# Patient Record
Sex: Male | Born: 1955 | Race: White | Hispanic: No | Marital: Married | Smoking: Former smoker
Health system: Southern US, Community
[De-identification: ages and names within clinical notes are randomized; demographics above are authoritative.]

## PROBLEM LIST (undated history)

## (undated) DIAGNOSIS — M1711 Unilateral primary osteoarthritis, right knee: Secondary | ICD-10-CM

## (undated) HISTORY — DX: Unilateral primary osteoarthritis, right knee: M17.11

## (undated) HISTORY — PX: BACK SURGERY: SHX140

---

## 2019-06-22 ENCOUNTER — Ambulatory Visit
Admission: EM | Admit: 2019-06-22 | Discharge: 2019-06-22 | Disposition: A | Discharge status: Home / Self Care | Payer: BC Managed Care – PPO | Attending: Emergency Medicine | Admitting: Emergency Medicine

## 2019-06-22 ENCOUNTER — Ambulatory Visit (INDEPENDENT_AMBULATORY_CARE_PROVIDER_SITE_OTHER): Payer: BC Managed Care – PPO

## 2019-06-22 ENCOUNTER — Other Ambulatory Visit: Payer: Self-pay

## 2019-06-22 ENCOUNTER — Encounter: Payer: Self-pay | Admitting: Emergency Medicine

## 2019-06-22 DIAGNOSIS — J189 Pneumonia, unspecified organism: Secondary | ICD-10-CM

## 2019-06-22 DIAGNOSIS — R05 Cough: Secondary | ICD-10-CM

## 2019-06-22 DIAGNOSIS — S2231XD Fracture of one rib, right side, subsequent encounter for fracture with routine healing: Secondary | ICD-10-CM

## 2019-06-22 DIAGNOSIS — R059 Cough, unspecified: Secondary | ICD-10-CM

## 2019-06-22 DIAGNOSIS — R0602 Shortness of breath: Secondary | ICD-10-CM | POA: Diagnosis not present

## 2019-06-22 MED ORDER — LEVOFLOXACIN 500 MG PO TABS: 500.0000 mg | ORAL_TABLET | Freq: Every day | ORAL | 0 refills | Status: DC

## 2019-06-22 MED ORDER — BENZONATATE 100 MG PO CAPS: 100.0000 mg | ORAL_CAPSULE | Freq: Three times a day (TID) | ORAL | 0 refills | Status: DC

## 2019-06-22 MED ORDER — IBUPROFEN 800 MG PO TABS: 800.0000 mg | ORAL_TABLET | Freq: Three times a day (TID) | ORAL | 0 refills | Status: DC

## 2019-06-22 NOTE — ED Provider Notes (Addendum)
[place]   [id number] 06/22/19 Arrival Time: 1632   CC: Cough   SUBJECTIVE: History from: patient.  Brent Davis is a 64 y.o. male who presents with intermittent non-productive cough, SOB x 1.5 weeks, and RT sided rib pain x 1 week.  Denies sick exposure to COVID, flu or strep.  Speculates he may have injured his RT ribs while lifting a pallet.  States his rib pain is intermittent and aching.  Has tried dayquil and nyquil with relief.  Symptoms are made worse with activity.  Reports hx of rib fractures in the past.   Denies fever, chills, fatigue, sinus pain, rhinorrhea, sore throat, wheezing, chest pain, nausea, vomiting, changes in bowel or bladder habits.    ROS: As per HPI.  All other pertinent ROS negative.     History reviewed. No pertinent past medical history. Past Surgical History:  Procedure Laterality Date  . BACK SURGERY     Allergies  Allergen Reactions  . Penicillins    No current facility-administered medications on file prior to encounter.   Current Outpatient Medications on File Prior to Encounter  Medication Sig Dispense Refill  . Pseudoeph-Doxylamine-DM-APAP (NYQUIL PO) Take by mouth.     Social History   Socioeconomic History  . Marital status: Married    Spouse name: Not on file  . Number of children: Not on file  . Years of education: Not on file  . Highest education level: Not on file  Occupational History  . Not on file  Tobacco Use  . Smoking status: Never Smoker  Substance and Sexual Activity  . Alcohol use: Yes  . Drug use: Never  . Sexual activity: Not on file  Other Topics Concern  . Not on file  Social History Narrative  . Not on file   Social Determinants of Health   Financial Resource Strain:   . Difficulty of Paying Living Expenses: Not on file  Food Insecurity:   . Worried About Charity fundraiser in the Last Year: Not on file  . Ran Out of Food in the Last Year: Not on file  Transportation Needs:   . Lack  of Transportation (Medical): Not on file  . Lack of Transportation (Non-Medical): Not on file  Physical Activity:   . Days of Exercise per Week: Not on file  . Minutes of Exercise per Session: Not on file  Stress:   . Feeling of Stress : Not on file  Social Connections:   . Frequency of Communication with Friends and Family: Not on file  . Frequency of Social Gatherings with Friends and Family: Not on file  . Attends Religious Services: Not on file  . Active Member of Clubs or Organizations: Not on file  . Attends Archivist Meetings: Not on file  . Marital Status: Not on file  Intimate Partner Violence:   . Fear of Current or Ex-Partner: Not on file  . Emotionally Abused: Not on file  . Physically Abused: Not on file  . Sexually Abused: Not on file   Family History  Problem Relation Age of Onset  . Diabetes Father   . Epilepsy Father     OBJECTIVE:  Vitals:   06/22/19 1659  BP: 132/68  Pulse: 75  Resp: (!) 24  Temp: 98.3 F (36.8 C)  TempSrc: Oral  SpO2: 94%     General appearance: alert; well-appearing, nontoxic; speaking in full sentences and tolerating own secretions HEENT: NCAT; Ears: EACs clear, TMs pearly gray; Eyes:  PERRL.  EOM grossly intact. Nose: nares patent without rhinorrhea, Throat: oropharynx clear, tonsils non erythematous or enlarged, uvula midline  Neck: supple without LAD Lungs: unlabored respirations, symmetrical air entry; cough: absent; no respiratory distress; CTAB Heart: regular rate and rhythm.   Chest wall: Mildly TTP over RT lower ribcage Skin: warm and dry Psychological: alert and cooperative; normal mood and affect  DX STUDIES:  DG Chest 2 View  Result Date: 06/22/2019 CLINICAL DATA:  Cough, shortness of breath EXAM: CHEST - 2 VIEW COMPARISON:  None. FINDINGS: Airspace opacity in the right lower lobe concerning for pneumonia. Left lung clear. Heart is normal size. No effusions. Old right rib fractures. IMPRESSION: Airspace  opacity in the right lung base concerning for pneumonia. Followup PA and lateral chest X-ray is recommended in 3-4 weeks following trial of antibiotic therapy to ensure resolution and exclude underlying malignancy. Old right rib fractures. Electronically Signed   By: Rolm Baptise M.D.   On: 06/22/2019 17:20   X-rays positive RT lower infiltrate with healing RT lower rib fractures.  I have reviewed the x-rays myself and the radiologist interpretation. I am in agreement with the radiologist interpretation.     ASSESSMENT & PLAN:  1. Pneumonia of right lower lobe due to infectious organism   2. Cough   3. Closed fracture of one rib of right side with routine healing, subsequent encounter     Meds ordered this encounter  Medications  . levofloxacin (LEVAQUIN) 500 MG tablet    Sig: Take 1 tablet (500 mg total) by mouth daily.    Dispense:  10 tablet    Refill:  0    Order Specific Question:   Supervising Provider    Answer:   Raylene Everts [id number]  . ibuprofen (ADVIL) 800 MG tablet    Sig: Take 1 tablet (800 mg total) by mouth 3 (three) times daily.    Dispense:  30 tablet    Refill:  0    Order Specific Question:   Supervising Provider    Answer:   Raylene Everts [id number]  . benzonatate (TESSALON) 100 MG capsule    Sig: Take 1 capsule (100 mg total) by mouth every 8 (eight) hours.    Dispense:  21 capsule    Refill:  0    Order Specific Question:   Supervising Provider    Answer:   Raylene Everts [id number]   Pneumonia/ Rib fractures: X-rays concerning for pneumonia and rib fractures  Rest and push fluids Tessalon Perles prescribed for cough Levofloxacin prescribed.  Take as directed and to completion Ibuprofen 800 mg prescribed.  Use as directed for pain Apply ice as needed Follow up with PCP in 6 weeks for repeat x-ray and to ensure symptoms are improving  COVID test: COVID testing ordered.  It will take between 5-7 days for test results.  Someone will contact  you regarding abnormal results.   In the meantime: You should remain isolated in your home for 10 days from symptom onset AND greater than 72 hours after symptoms resolution (absence of fever without the use of fever-reducing medication and improvement in respiratory symptoms), whichever is longer Get plenty of rest and push fluids  Call or go to the ED if you have any new or worsening symptoms such as fever, worsening cough, shortness of breath, chest tightness, chest pain, turning blue, changes in mental status, difficulty breathing, chest pain, worsening symtpoms despite medication/ treatment, etc...   Reviewed expectations re: course of current  medical issues. Questions answered. Outlined signs and symptoms indicating need for more acute intervention. Patient verbalized understanding. After Visit Summary given.         Lestine Box, PA-C 06/22/19 [place], [place], PA-C 06/22/19 1747

## 2019-06-22 NOTE — ED Triage Notes (Signed)
Symptoms for 1 - 1 1/2 weeks.  No fever.  Cough, non productive.  Patient is feeling "tired" patient also feels "winded".  Patient has pain in right torso, thinks injured lifting pallets.

## 2019-06-22 NOTE — Discharge Instructions (Signed)
Pneumonia/ Rib fractures: X-rays concerning for pneumonia and rib fractures  Rest and push fluids Tessalon Perles prescribed for cough Levofloxacin prescribed.  Take as directed and to completion Ibuprofen 800 mg prescribed.  Use as directed for pain Apply ice as needed Follow up with PCP in 6 weeks for repeat x-ray and to ensure symptoms are improving  COVID test: COVID testing ordered.  It will take between 5-7 days for test results.  Someone will contact you regarding abnormal results.   In the meantime: You should remain isolated in your home for 10 days from symptom onset AND greater than 72 hours after symptoms resolution (absence of fever without the use of fever-reducing medication and improvement in respiratory symptoms), whichever is longer Get plenty of rest and push fluids  Call or go to the ED if you have any new or worsening symptoms such as fever, worsening cough, shortness of breath, chest tightness, chest pain, turning blue, changes in mental status, difficulty breathing, chest pain, worsening symtpoms despite medication/ treatment, etc..Marland Kitchen

## 2019-06-23 LAB — NOVEL CORONAVIRUS, NAA: SARS-CoV-2, NAA: NOT DETECTED

## 2019-12-20 ENCOUNTER — Other Ambulatory Visit: Payer: Self-pay

## 2019-12-20 ENCOUNTER — Ambulatory Visit
Admission: EM | Admit: 2019-12-20 | Discharge: 2019-12-20 | Disposition: A | Discharge status: Home / Self Care | Payer: BC Managed Care – PPO | Attending: Emergency Medicine | Admitting: Emergency Medicine

## 2019-12-20 ENCOUNTER — Encounter: Payer: Self-pay | Admitting: Emergency Medicine

## 2019-12-20 DIAGNOSIS — Z1152 Encounter for screening for COVID-19: Secondary | ICD-10-CM | POA: Diagnosis not present

## 2019-12-20 NOTE — Discharge Instructions (Addendum)

## 2019-12-20 NOTE — ED Triage Notes (Signed)
Needs covid test for travel  

## 2019-12-20 NOTE — ED Provider Notes (Addendum)
[place]   [id number] 12/20/19 Arrival Time: 3570   CC: COVID screen for travel  SUBJECTIVE: History from: patient.  Brent Davis is a 64 y.o. male  who presents for COVID testing for travel.  Denies sick exposure to COVID, flu or strep.  Denies recent travel.  Denies aggravating or alleviating symptoms.  Denies previous COVID infection.   Denies fever, chills, fatigue, nasal congestion, rhinorrhea, sore throat, cough, SOB, wheezing, chest pain, nausea, vomiting, changes in bowel or bladder habits.    ROS: As per HPI.  All other pertinent ROS negative.     History reviewed. No pertinent past medical history. Past Surgical History:  Procedure Laterality Date  . BACK SURGERY     Allergies  Allergen Reactions  . Penicillins    No current facility-administered medications on file prior to encounter.   Current Outpatient Medications on File Prior to Encounter  Medication Sig Dispense Refill  . benzonatate (TESSALON) 100 MG capsule Take 1 capsule (100 mg total) by mouth every 8 (eight) hours. 21 capsule 0  . ibuprofen (ADVIL) 800 MG tablet Take 1 tablet (800 mg total) by mouth 3 (three) times daily. 30 tablet 0  . levofloxacin (LEVAQUIN) 500 MG tablet Take 1 tablet (500 mg total) by mouth daily. 10 tablet 0  . Pseudoeph-Doxylamine-DM-APAP (NYQUIL PO) Take by mouth.     Social History   Socioeconomic History  . Marital status: Married    Spouse name: Not on file  . Number of children: Not on file  . Years of education: Not on file  . Highest education level: Not on file  Occupational History  . Not on file  Tobacco Use  . Smoking status: Never Smoker  . Smokeless tobacco: Never Used  Substance and Sexual Activity  . Alcohol use: Yes  . Drug use: Never  . Sexual activity: Not on file  Other Topics Concern  . Not on file  Social History Narrative  . Not on file   Social Determinants of Health   Financial Resource Strain:   . Difficulty of Paying  Living Expenses:   Food Insecurity:   . Worried About Charity fundraiser in the Last Year:   . Arboriculturist in the Last Year:   Transportation Needs:   . Film/video editor (Medical):   Marland Kitchen Lack of Transportation (Non-Medical):   Physical Activity:   . Days of Exercise per Week:   . Minutes of Exercise per Session:   Stress:   . Feeling of Stress :   Social Connections:   . Frequency of Communication with Friends and Family:   . Frequency of Social Gatherings with Friends and Family:   . Attends Religious Services:   . Active Member of Clubs or Organizations:   . Attends Archivist Meetings:   Marland Kitchen Marital Status:   Intimate Partner Violence:   . Fear of Current or Ex-Partner:   . Emotionally Abused:   Marland Kitchen Physically Abused:   . Sexually Abused:    Family History  Problem Relation Age of Onset  . Diabetes Father   . Epilepsy Father     OBJECTIVE:  Vitals:   12/20/19 1607  BP: 126/79  Pulse: (!) 58  Resp: 17  Temp: 98.2 F (36.8 C)  TempSrc: Oral  SpO2: 94%     General appearance: alert; appears fatigued, but nontoxic; speaking in full sentences and tolerating own secretions HEENT: NCAT; Ears: EACs clear, TMs pearly gray; Eyes: PERRL.  EOM grossly intact. Sinuses: nontender; Nose: nares patent without rhinorrhea, Throat: oropharynx clear, tonsils non erythematous or enlarged, uvula midline  Neck: supple without LAD Lungs: unlabored respirations, symmetrical air entry; cough: absent; no respiratory distress; CTAB Heart: regular rate and rhythm.  Radial pulses 2+ symmetrical bilaterally Skin: warm and dry Psychological: alert and cooperative; normal mood and affect  LABS:  No results found for this or any previous visit (from the past 24 hour(s)).   ASSESSMENT & PLAN:  1. Encounter for screening for COVID-19     No orders of the defined types were placed in this encounter.   Discharge Instructions...    COVID testing ordered.  It will take  between 2-7 days for test results.  Someone will contact you regarding abnormal results.    In the meantime: You should remain isolated in your home for 10 days from symptom onset AND greater than 24 hours after symptoms resolution (absence of fever without the use of fever-reducing medication and improvement in respiratory symptoms), whichever is longer Get plenty of rest and push fluids Use medications daily for symptom relief Use OTC medications like ibuprofen or tylenol as needed fever or pain Call or go to the ED if you have any new or worsening symptoms such as fever, worsening cough, shortness of breath, chest tightness, chest pain, turning blue, changes in mental status, etc...   Reviewed expectations re: course of current medical issues. Questions answered. Outlined signs and symptoms indicating need for more acute intervention. Patient verbalized understanding. After Visit Summary given.      Note: This document was prepared using Dragon voice recognition software and may include unintentional dictation errors.     Emerson Monte, FNP 12/20/19 1614    Emerson Monte, FNP 12/20/19 1615

## 2019-12-22 LAB — SARS-COV-2, NAA 2 DAY TAT

## 2019-12-22 LAB — NOVEL CORONAVIRUS, NAA: SARS-CoV-2, NAA: NOT DETECTED

## 2020-10-15 ENCOUNTER — Other Ambulatory Visit: Payer: Self-pay

## 2020-10-15 ENCOUNTER — Ambulatory Visit
Admission: EM | Admit: 2020-10-15 | Discharge: 2020-10-15 | Discharge status: Against Medical Advice | Payer: BC Managed Care – PPO

## 2020-10-15 ENCOUNTER — Ambulatory Visit (INDEPENDENT_AMBULATORY_CARE_PROVIDER_SITE_OTHER): Payer: BC Managed Care – PPO

## 2020-10-15 ENCOUNTER — Ambulatory Visit
Admission: EM | Admit: 2020-10-15 | Discharge: 2020-10-15 | Disposition: A | Discharge status: Home / Self Care | Payer: BC Managed Care – PPO | Attending: Family Medicine | Admitting: Family Medicine

## 2020-10-15 DIAGNOSIS — M25561 Pain in right knee: Secondary | ICD-10-CM

## 2020-10-15 DIAGNOSIS — S8391XA Sprain of unspecified site of right knee, initial encounter: Secondary | ICD-10-CM

## 2020-10-15 NOTE — Discharge Instructions (Addendum)
May take 800 mg ibuprofen with 1000 mg of Tylenol.  Do not exceed 4000 mg of Tylenol in 24 hours.  Suspect soft tissue injury  We have put a brace on your knee.  Follow up with orthopedics for further imaging and management

## 2020-10-15 NOTE — ED Triage Notes (Signed)
Pt presents with right knee pain, from injury last week. Heard a pop but swelling and pain have not improved

## 2020-10-22 NOTE — ED Provider Notes (Signed)
[place]   [id number] 10/15/20 Arrival Time: 1757  ZR:[id number] PAIN  SUBJECTIVE: History from: patient. Brent Davis is a 65 y.o. male complains of right knee pain that began about a week ago.  Reports that he was working and felt a pop in the knee. States that the area has been swollen and painful since then. Localizes the pain to the medial aspect of the right knee. Describes the pain as constant and achy in character with intermittent sharp pain. Has tried OTC medications without relief. Symptoms are made worse with activity. Denies similar symptoms in the past.  Denies fever, chills, erythema, ecchymosis, weakness, numbness and tingling, saddle paresthesias, loss of bowel or bladder function.      ROS: As per HPI.  All other pertinent ROS negative.     History reviewed. No pertinent past medical history. Past Surgical History:  Procedure Laterality Date   BACK SURGERY     Allergies  Allergen Reactions   Penicillins    No current facility-administered medications on file prior to encounter.   Current Outpatient Medications on File Prior to Encounter  Medication Sig Dispense Refill   benzonatate (TESSALON) 100 MG capsule Take 1 capsule (100 mg total) by mouth every 8 (eight) hours. 21 capsule 0   ibuprofen (ADVIL) 800 MG tablet Take 1 tablet (800 mg total) by mouth 3 (three) times daily. 30 tablet 0   levofloxacin (LEVAQUIN) 500 MG tablet Take 1 tablet (500 mg total) by mouth daily. 10 tablet 0   Pseudoeph-Doxylamine-DM-APAP (NYQUIL PO) Take by mouth.     Social History   Socioeconomic History   Marital status: Married    Spouse name: Not on file   Number of children: Not on file   Years of education: Not on file   Highest education level: Not on file  Occupational History   Not on file  Tobacco Use   Smoking status: Never   Smokeless tobacco: Never  Substance and Sexual Activity   Alcohol use: Yes   Drug use: Never   Sexual activity: Not on file  Other  Topics Concern   Not on file  Social History Narrative   Not on file   Social Determinants of Health   Financial Resource Strain: Not on file  Food Insecurity: Not on file  Transportation Needs: Not on file  Physical Activity: Not on file  Stress: Not on file  Social Connections: Not on file  Intimate Partner Violence: Not on file   Family History  Problem Relation Age of Onset   Diabetes Father    Epilepsy Father     OBJECTIVE:  Vitals:   10/15/20 1824  BP: (!) 143/87  Pulse: (!) 56  Resp: 16  Temp: (!) 97.3 F (36.3 C)  TempSrc: Tympanic  SpO2: 98%    General appearance: ALERT; in no acute distress.  Head: NCAT Lungs: Normal respiratory effort CV: pulses 2+ bilaterally. Cap refill < 2 seconds Musculoskeletal:  Inspection: Skin warm, dry, clear and intact No erythema noted Effusion to anterior right knee Palpation: Medial right knee tender to palpation ROM: Limited ROM active and passive to right knee Skin: warm and dry Neurologic: Ambulates without difficulty; Sensation intact about the upper/ lower extremities Psychological: alert and cooperative; normal mood and affect  DIAGNOSTIC STUDIES:  No results found.   ASSESSMENT & PLAN:  1. Sprain of right knee, unspecified ligament, initial encounter   2. Acute pain of right knee    Xray is negative for fracture or  misalignment today Brace applied to right knee in office Wear this when up and active Continue conservative management of rest, ice, and gentle stretches Take ibuprofen as needed for pain relief (may cause abdominal discomfort, ulcers, and GI bleeds avoid taking with other NSAIDs) Follow up with orthopedics Suspect soft tissue injury given continued pain and swelling Return or go to the ER if you have any new or worsening symptoms (fever, chills, chest pain, abdominal pain, changes in bowel or bladder habits, pain radiating into lower legs)   Reviewed expectations re: course of current medical  issues. Questions answered. Outlined signs and symptoms indicating need for more acute intervention. Patient verbalized understanding. After Visit Summary given.        Faustino Congress, NP 10/22/20 [phone]

## 2020-11-01 ENCOUNTER — Encounter: Payer: Self-pay | Admitting: Orthopaedic Surgery

## 2020-11-01 ENCOUNTER — Ambulatory Visit (INDEPENDENT_AMBULATORY_CARE_PROVIDER_SITE_OTHER): Payer: BC Managed Care – PPO | Admitting: Orthopaedic Surgery

## 2020-11-01 ENCOUNTER — Other Ambulatory Visit: Payer: Self-pay

## 2020-11-01 VITALS — BP 126/82 | HR 64 | Ht 71.0 in | Wt 192.5 lb

## 2020-11-01 DIAGNOSIS — M25561 Pain in right knee: Secondary | ICD-10-CM | POA: Diagnosis not present

## 2020-11-01 DIAGNOSIS — G8929 Other chronic pain: Secondary | ICD-10-CM

## 2020-11-01 DIAGNOSIS — M25361 Other instability, right knee: Secondary | ICD-10-CM

## 2020-11-01 NOTE — Progress Notes (Signed)
Subjective:    Patient ID: Brent Davis, male    DOB: 1956-02-18, 65 y.o.   MRN: [id number]  HPI He hurt his right knee while working at [employer] when taking a pallet of fruit juice off a truck.  He pulled jack to get the pallet moving when he felt a pop and then had pain in the right knee.  His pain got worse.  He went to Urgent Care on 10-15-20. The injury was about a week earlier.  He has pain, swelling of the right knee, giving way now and medial pain.  I have reviewed the notes and X-rays.  I have independently reviewed and interpreted x-rays of this patient done at another site by another physician or qualified health professional.  His knee is getting gradually worse.  He has pain at night and pain when walking.  He has giving way.  He has no redness or numbness.  Rest, ice, heat and rubs have not helped. Advil has not helped that much.   Review of Systems  Constitutional:  Positive for activity change.  Musculoskeletal:  Positive for arthralgias, back pain, gait problem and joint swelling.  All other systems reviewed and are negative. For Review of Systems, all other systems reviewed and are negative.  The following is a summary of the past history medically, past history surgically, known current medicines, social history and family history.  This information is gathered electronically by the computer from prior information and documentation.  I review this each visit and have found including this information at this point in the chart is beneficial and informative.   History reviewed. No pertinent past medical history.  Past Surgical History:  Procedure Laterality Date   BACK SURGERY      Current Outpatient Medications on File Prior to Visit  Medication Sig Dispense Refill   benzonatate (TESSALON) 100 MG capsule Take 1 capsule (100 mg total) by mouth every 8 (eight) hours. 21 capsule 0   ibuprofen (ADVIL) 800 MG tablet Take 1 tablet (800 mg total) by mouth 3 (three)  times daily. 30 tablet 0   levofloxacin (LEVAQUIN) 500 MG tablet Take 1 tablet (500 mg total) by mouth daily. 10 tablet 0   Pseudoeph-Doxylamine-DM-APAP (NYQUIL PO) Take by mouth.     No current facility-administered medications on file prior to visit.    Social History   Socioeconomic History   Marital status: Married    Spouse name: Not on file   Number of children: Not on file   Years of education: Not on file   Highest education level: Not on file  Occupational History   Not on file  Tobacco Use   Smoking status: Never   Smokeless tobacco: Never  Substance and Sexual Activity   Alcohol use: Yes   Drug use: Never   Sexual activity: Not on file  Other Topics Concern   Not on file  Social History Narrative   Not on file   Social Determinants of Health   Financial Resource Strain: Not on file  Food Insecurity: Not on file  Transportation Needs: Not on file  Physical Activity: Not on file  Stress: Not on file  Social Connections: Not on file  Intimate Partner Violence: Not on file    Family History  Problem Relation Age of Onset   Diabetes Father    Epilepsy Father     BP 126/82   Pulse 64   Ht 5\' 11"  (1.803 m)   Wt 192 lb 8  oz (87.3 kg)   BMI 26.85 kg/m   Body mass index is 26.85 kg/m.     Objective:   Physical Exam Vitals and nursing note reviewed. Exam conducted with a chaperone present.  Constitutional:      Appearance: He is well-developed.  HENT:     Head: Normocephalic and atraumatic.  Eyes:     Conjunctiva/sclera: Conjunctivae normal.     Pupils: Pupils are equal, round, and reactive to light.  Cardiovascular:     Rate and Rhythm: Normal rate and regular rhythm.  Pulmonary:     Effort: Pulmonary effort is normal.  Abdominal:     Palpations: Abdomen is soft.  Musculoskeletal:     Cervical back: Normal range of motion and neck supple.       Legs:  Skin:    General: Skin is warm and dry.  Neurological:     Mental Status: He is alert  and oriented to person, place, and time.     Cranial Nerves: No cranial nerve deficit.     Motor: No abnormal muscle tone.     Coordination: Coordination normal.     Deep Tendon Reflexes: Reflexes are normal and symmetric. Reflexes normal.  Psychiatric:        Behavior: Behavior normal.        Thought Content: Thought content normal.        Judgment: Judgment normal.          Assessment & Plan:   Encounter Diagnoses  Name Primary?   Chronic pain of right knee Yes   Knee instability, right    I am very concerned about medial meniscus injury.  He also has slight laxity of ACL.  I will get MRI.  We need to get approval from [employer]  Return in three weeks.  Call if any problem.  Precautions discussed.  Electronically Signed Sanjuana Kava, MD 6/23/202211:06 AM

## 2020-11-22 ENCOUNTER — Ambulatory Visit: Payer: BC Managed Care – PPO | Admitting: Orthopaedic Surgery

## 2020-11-27 ENCOUNTER — Other Ambulatory Visit: Payer: Self-pay

## 2020-11-27 ENCOUNTER — Ambulatory Visit (HOSPITAL_COMMUNITY)
Admission: RE | Admit: 2020-11-27 | Discharge: 2020-11-27 | Disposition: A | Discharge status: Home / Self Care | Payer: BC Managed Care – PPO | Source: Ambulatory Visit | Attending: Orthopaedic Surgery | Admitting: Orthopaedic Surgery

## 2020-11-27 DIAGNOSIS — M25361 Other instability, right knee: Secondary | ICD-10-CM | POA: Insufficient documentation

## 2020-11-29 ENCOUNTER — Ambulatory Visit (INDEPENDENT_AMBULATORY_CARE_PROVIDER_SITE_OTHER): Payer: BC Managed Care – PPO | Admitting: Orthopaedic Surgery

## 2020-11-29 ENCOUNTER — Other Ambulatory Visit: Payer: Self-pay

## 2020-11-29 ENCOUNTER — Encounter: Payer: Self-pay | Admitting: Orthopaedic Surgery

## 2020-11-29 DIAGNOSIS — S83242D Other tear of medial meniscus, current injury, left knee, subsequent encounter: Secondary | ICD-10-CM

## 2020-11-29 NOTE — Patient Instructions (Signed)
Return to clinic to see Dr. Aline Brochure or Dr Amedeo Kinsman for surgery consultation. Note for work, no squatting

## 2020-11-29 NOTE — Progress Notes (Signed)
My knee hurts more.  He has right knee pain with giving way, swelling, popping.  He had MRI which showed:  IMPRESSION: Complete radial tear at or just peripheral to the root of the posterior horn of the medial meniscus.   Small, minimally depressed fractures of the subchondral bone plate of the weight-bearing medial femoral condyle and adjacent medial tibial plateau with associated marrow edema. Edema is more extensive in the femur.   0.3 x 1.2 cm near full-thickness cartilage defect weight-bearing medial femoral condyle. There is a smaller near full-thickness cartilage defect in the posterior weight-bearing lateral femoral condyle.   I have explained the findings to him.  I have told him about the fractures which are healing and do not need surgery.  I have explained the cartilage defect.  I will have him see Dr. Aline Brochure or Dr. Amedeo Kinsman for possible arthroscopy.  I have independently reviewed the MRI.    ROM of the right knee is 0 to 105, pain medially, effusion, crepitus, positive medial McMurray, no distal edema, NV intact.  Encounter Diagnosis  Name Primary?   Other tear of medial meniscus, current injury, left knee, subsequent encounter Yes   See Dr. Lemmie Evens or Dr. Loletha Grayer.  Call if any problem.  Precautions discussed.  Electronically Signed Brent Kava, MD 7/21/20229:20 AM

## 2020-12-14 ENCOUNTER — Other Ambulatory Visit: Payer: Self-pay

## 2020-12-14 ENCOUNTER — Encounter: Payer: Self-pay | Admitting: Orthopedic Surgery

## 2020-12-14 ENCOUNTER — Ambulatory Visit (INDEPENDENT_AMBULATORY_CARE_PROVIDER_SITE_OTHER): Payer: BC Managed Care – PPO | Admitting: Orthopedic Surgery

## 2020-12-14 VITALS — BP 151/70 | HR 72 | Ht 71.0 in | Wt 194.2 lb

## 2020-12-14 DIAGNOSIS — S83231A Complex tear of medial meniscus, current injury, right knee, initial encounter: Secondary | ICD-10-CM | POA: Diagnosis not present

## 2020-12-14 NOTE — Patient Instructions (Signed)
Out of work until the next visit

## 2020-12-14 NOTE — Progress Notes (Signed)
New Patient Visit  Assessment: Brent Davis is a 65 y.o. male with the following: Right knee pain, with posterior medial meniscus tear, adjacent to the root; extensive bony edema within the medial femoral condyle and medial tibial plateau  Plan: Reviewed radiographs and the MRI with patient in clinic today.  He has exquisite tenderness to palpation within the medial femoral condyle, as well as the medial tibial plateau.  He does exhibit some tenderness along the medial joint line.  My biggest concern at this point, as the areas that are lighting up on the MRI.  This was discussed with the patient, and he is aware that these are essentially nondisplaced fractures.  I do not think that surgery at this time will improve his overall symptoms.  I have recommended that he stay off of his leg is much as possible, and have provided him with a letter excusing him from work for the next 6 weeks.  Depending on his physical exam at the next clinic visit, we could discuss an arthroscopic procedure in more detail.  All questions were answered he is amenable this plan.   Follow-up: Return in about 6 weeks (around 01/25/2021).  Subjective:  Chief Complaint  Patient presents with   Knee Pain    Consult for right knee/DR.K pt    History of Present Illness: Brent Davis is a 65 y.o. male who presents for evaluation of right knee pain.  He is previously been evaluated by Dr. Luna Glasgow, and referred to me for surgical discussion.  Approximately 2 months ago, he sustained an injury to the medial aspect of his right knee, while trying to move a palate off a truck at work.  He felt a pop in his right knee.  He went to the emergency department about a week later.  He noticed some swelling in the right knee.  This is since improved.  He is not taking any medications on a consistent basis.  He has continued to work.  However, he notices significant discomfort with certain motions.  He has difficulty with heavier  objects.  Overall, his pain has improved.  He has obtained an MRI, and presents today for further discussion.   Review of Systems: No fevers or chills No numbness or tingling No chest pain No shortness of breath No bowel or bladder dysfunction No GI distress No headaches   Medical History:  No past medical history on file.  Past Surgical History:  Procedure Laterality Date   BACK SURGERY      Family History  Problem Relation Age of Onset   Diabetes Father    Epilepsy Father    Social History   Tobacco Use   Smoking status: Never   Smokeless tobacco: Never  Substance Use Topics   Alcohol use: Yes   Drug use: Never    Allergies  Allergen Reactions   Penicillins     No outpatient medications have been marked as taking for the 12/14/20 encounter (Office Visit) with Mordecai Rasmussen, MD.    Objective: BP (!) 151/70   Pulse 72   Ht '5\' 11"'$  (1.803 m)   Wt 194 lb 3.2 oz (88.1 kg)   BMI 27.09 kg/m   Physical Exam:  General: Alert and oriented. and No acute distress. Gait: Right sided antalgic gait.  Evaluation of the right knee demonstrates a mild effusion.  Neutral overall alignment.  Range of motion from 0 to 100 degrees.  He has some difficulty with flexion beyond 100 degrees.  He  has exquisite tenderness to palpation over the medial femoral condyle.  Exquisite tenderness palpation over the medial tibial plateau.  Mild tenderness to palpation along the medial joint line.  He also has some tenderness in the posterior lateral aspect of his knee.  This is vague.  Not no specific focal tenderness.  He does have a Baker's cyst.  Negative Lachman.  IMAGING: I personally reviewed images previously obtained in clinic  X-rays of the right knee demonstrate neutral overall alignment.  Mild degenerative changes overall.  MRI of the right knee demonstrates extensive bony edema within the medial femoral condyle, as well as the medial tibial plateau.  No depression is  appreciated.  He has a radial tear adjacent to the posterior root of the medial meniscus.  No significant displacement of the meniscus.  Small full-thickness cartilage defect of the weightbearing aspect of the medial femoral condyle.  New Medications:  No orders of the defined types were placed in this encounter.     Mordecai Rasmussen, MD  12/15/2020 7:29 AM

## 2020-12-15 ENCOUNTER — Encounter: Payer: Self-pay | Admitting: Orthopedic Surgery

## 2020-12-19 ENCOUNTER — Telehealth: Payer: Self-pay | Admitting: Orthopedic Surgery

## 2020-12-19 NOTE — Telephone Encounter (Signed)
Called patient to relay forms received in fax from [employer]. Patient states that this is not part of workers comp, but is for short-term disability. Discussed [employer] form/fee process. Aware.

## 2021-01-09 ENCOUNTER — Telehealth: Payer: Self-pay

## 2021-01-09 NOTE — Telephone Encounter (Signed)
Patient relays that Worker's comp has denied his claim; therefore, we can keep all related services under his health insurance information as we have been doing. States worker's comp, and attorney's office, notified him that there was no accident involved; therefore, denied.

## 2021-01-09 NOTE — Telephone Encounter (Signed)
Pt stated he was calling to give you information. Please call back.

## 2021-01-23 ENCOUNTER — Encounter: Payer: Self-pay | Admitting: Orthopedic Surgery

## 2021-01-23 ENCOUNTER — Ambulatory Visit: Payer: BC Managed Care – PPO

## 2021-01-23 ENCOUNTER — Ambulatory Visit (INDEPENDENT_AMBULATORY_CARE_PROVIDER_SITE_OTHER): Payer: BC Managed Care – PPO | Admitting: Orthopedic Surgery

## 2021-01-23 ENCOUNTER — Other Ambulatory Visit: Payer: Self-pay

## 2021-01-23 VITALS — BP 130/77 | HR 71 | Ht 71.0 in | Wt 203.0 lb

## 2021-01-23 DIAGNOSIS — G8929 Other chronic pain: Secondary | ICD-10-CM

## 2021-01-23 DIAGNOSIS — S83231A Complex tear of medial meniscus, current injury, right knee, initial encounter: Secondary | ICD-10-CM | POA: Diagnosis not present

## 2021-01-23 DIAGNOSIS — M1711 Unilateral primary osteoarthritis, right knee: Secondary | ICD-10-CM | POA: Diagnosis not present

## 2021-01-23 NOTE — Progress Notes (Signed)
Orthopaedic Clinic Return  Assessment: Brent Davis is a 65 y.o. male with the following: Right knee arthritis, posterior horn medial meniscus tear  Plan: Pain is improved.  He would like to go back to work.  He was fitted to with a brace and advised to wear it to work.  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  He is in agreement.  Follow up in 6 weeks.   Follow-up: Return in about 6 weeks (around 03/06/2021).   Subjective:  Chief Complaint  Patient presents with   Knee Pain    Rt knee f/u. Here to discuss back to work and restrictions.     History of Present Illness: Brent Davis is a 65 y.o. male who returns to clinic for repeat evaluation of his right knee pain.  He has been out of work for the past 6 weeks.  His pain is improved.  He does not take any medications.  He has never had an injection.  Pain is primarily in his medial knee.  He wants to return to work.  He would like to avoid surgery.  He states that he can retire in a little over a year.    Review of Systems: No fevers or chills No numbness or tingling No chest pain No shortness of breath No bowel or bladder dysfunction No GI distress No headaches   Objective: BP 130/77   Pulse 71   Ht '5\' 11"'$  (1.803 m)   Wt 203 lb (92.1 kg)   BMI 28.31 kg/m   Physical Exam:  Alert and oriented, no acute distress  Right sided antalgic gait.  Mild varus alignment.  Tender over the medial femoral condyle.  Mild tenderness over the medial tibial plateau.  Minimal tenderness over the medial joint line.  Near full extension.  Flexion beyond 120 degrees.   IMAGING: I personally ordered and reviewed the following images:  XR of the right knee were obtained in clinic.  Mild varus alignment.   No acute injury.  Medial joint space narrowing.  Near complete loss medially.  Small osteophytes.  Contour of medial femoral condyle  remains unchanged.   Impression: right knee with moderate degenerative changes within the medial compartment.   Mordecai Rasmussen, MD 01/23/2021 2:52 PM

## 2021-01-24 ENCOUNTER — Encounter: Payer: Self-pay | Admitting: Orthopedic Surgery

## 2021-01-25 ENCOUNTER — Ambulatory Visit: Payer: BC Managed Care – PPO | Admitting: Orthopedic Surgery

## 2021-01-31 ENCOUNTER — Telehealth: Payer: Self-pay | Admitting: Orthopedic Surgery

## 2021-01-31 NOTE — Telephone Encounter (Signed)
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

## 2021-02-01 ENCOUNTER — Encounter: Payer: Self-pay | Admitting: Orthopedic Surgery

## 2021-02-01 NOTE — Telephone Encounter (Signed)
Per Dr Amedeo Kinsman', form updated "he should not lift or more than 100 lbs" / "to wear Playmaker brace at work". Note for work updated accordingly.  Form and note faxed to patient's employer, fax#[phone]. Called patient; aware.

## 2021-02-20 ENCOUNTER — Ambulatory Visit: Payer: BC Managed Care – PPO | Admitting: Orthopedic Surgery

## 2021-02-27 ENCOUNTER — Ambulatory Visit (INDEPENDENT_AMBULATORY_CARE_PROVIDER_SITE_OTHER): Payer: BC Managed Care – PPO | Admitting: Orthopedic Surgery

## 2021-02-27 ENCOUNTER — Other Ambulatory Visit: Payer: Self-pay

## 2021-02-27 ENCOUNTER — Encounter: Payer: Self-pay | Admitting: Orthopedic Surgery

## 2021-02-27 VITALS — BP 138/80 | HR 63 | Ht 71.0 in | Wt 201.0 lb

## 2021-02-27 DIAGNOSIS — S83241D Other tear of medial meniscus, current injury, right knee, subsequent encounter: Secondary | ICD-10-CM

## 2021-02-27 DIAGNOSIS — S83242D Other tear of medial meniscus, current injury, left knee, subsequent encounter: Secondary | ICD-10-CM

## 2021-02-27 NOTE — Progress Notes (Signed)
Orthopaedic Clinic Return  Assessment: Brent Davis is a 65 y.o. male with the following: Right knee arthritis, posterior horn medial meniscus tear  Plan: His pain is improved.  He has returned to work.  He has been wearing the brace, but stopped wearing it a week ago while at work.  He notes occasional pains in the medial aspect of his knee.  Occasional pain and difficulty with flexion in the posterior aspect of his knee.  He is not interested in proceeding with surgery at this time, which I think is reasonable.  He can continue using his brace.  He can take medications as needed.  He can also return for an injection in the right knee if his pain worsens.  No need to schedule follow-up appointment, but we are available if he needs anything in the future.  Follow-up: Return if symptoms worsen or fail to improve.   Subjective:  Chief Complaint  Patient presents with   Knee Pain    Rt knee pain much better but having some discomfort from bakers cyst    History of Present Illness: Brent Davis is a 65 y.o. male who returns to clinic for repeat evaluation of his right knee pain.  He has returned to work, and has been able to do most things.  Occasionally, he will avoid pulling heavier pallets because he knows it will cause some irritation potentially.  He has occasional pains in the posterior aspect of his knee, which does limit his flexion.  No Baker's cyst on the recent MRI.  He also states that that pain has been there for a while, prior to his most recent injury.  He has occasional sharp pains in the medial aspect of his right knee.  He is taking ibuprofen rarely.  He is pleased with the improvements since he for started to have the knee pain earlier this summer.   Review of Systems: No fevers or chills No numbness or tingling No chest pain No shortness of breath No bowel or bladder dysfunction No GI distress No headaches   Objective: BP 138/80   Pulse 63   Ht 5\' 11"   (1.803 m)   Wt 201 lb (91.2 kg)   BMI 28.03 kg/m   Physical Exam:  Alert and oriented, no acute distress  Right sided mild antalgic gait.  Mild varus alignment.  Minimal tenderness over the medial femoral condyle.  Mild tenderness over the medial tibial plateau.  Minimal tenderness over the medial joint line.  Near full extension.  Flexion beyond 120 degrees.   IMAGING: I personally ordered and reviewed the following images:  No new imaging obtained today.  Brent Rasmussen, MD 02/27/2021 9:25 AM

## 2021-08-16 ENCOUNTER — Ambulatory Visit
Admission: EM | Admit: 2021-08-16 | Discharge: 2021-08-16 | Disposition: A | Discharge status: Home / Self Care | Payer: No Typology Code available for payment source | Attending: Family Medicine | Admitting: Family Medicine

## 2021-08-16 ENCOUNTER — Encounter: Payer: Self-pay | Admitting: Emergency Medicine

## 2021-08-16 DIAGNOSIS — J209 Acute bronchitis, unspecified: Secondary | ICD-10-CM

## 2021-08-16 DIAGNOSIS — J3089 Other allergic rhinitis: Secondary | ICD-10-CM

## 2021-08-16 MED ORDER — CETIRIZINE HCL 10 MG PO TABS: 10.0000 mg | ORAL_TABLET | Freq: Every day | ORAL | 2 refills | Status: DC

## 2021-08-16 MED ORDER — FLUTICASONE PROPIONATE 50 MCG/ACT NA SUSP: 1.0000 | Freq: Two times a day (BID) | NASAL | 2 refills | Status: DC

## 2021-08-16 MED ORDER — PREDNISONE 20 MG PO TABS: 40.0000 mg | ORAL_TABLET | Freq: Every day | ORAL | 0 refills | Status: DC

## 2021-08-16 NOTE — ED Triage Notes (Signed)
Dry cough, irritated throat x 1.5 weeks.  Has been using mucinex without relief. ?

## 2021-08-16 NOTE — ED Provider Notes (Signed)
?[facility] ? ? ? ?CSN: [id number] ?Arrival date & time: 08/16/21  1135 ? ? ?  ? ?History   ?Chief Complaint ?Chief Complaint  ?Patient presents with  ? Cough  ? ? ?HPI ?Brent Davis is a 66 y.o. male.  ? ?Presenting today with 1 to 2 weeks of dry, hacking cough, scratchy throat, rhinorrhea.  Denies fever, chills, body aches, chest pain, shortness of breath, abdominal pain, nausea vomiting or diarrhea.  Has been taking Mucinex with no relief.  States he gets something like this the beginning of every spring. ? ?History reviewed. No pertinent past medical history. ? ?There are no problems to display for this patient. ? ? ?Past Surgical History:  ?Procedure Laterality Date  ? BACK SURGERY    ? ? ? ? ? ?Home Medications   ? ?Prior to Admission medications   ?Medication Sig Start Date End Date Taking? Authorizing Provider  ?cetirizine (ZYRTEC ALLERGY) 10 MG tablet Take 1 tablet (10 mg total) by mouth daily. 08/16/21  Yes Volney American, PA-C  ?fluticasone (FLONASE) 50 MCG/ACT nasal spray Place 1 spray into both nostrils 2 (two) times daily. 08/16/21  Yes Volney American, PA-C  ?predniSONE (DELTASONE) 20 MG tablet Take 2 tablets (40 mg total) by mouth daily with breakfast. 08/16/21  Yes Volney American, PA-C  ? ? ?Family History ?Family History  ?Problem Relation Age of Onset  ? Diabetes Father   ? Epilepsy Father   ? ? ?Social History ?Social History  ? ?Tobacco Use  ? Smoking status: Never  ? Smokeless tobacco: Never  ?Substance Use Topics  ? Alcohol use: Yes  ? Drug use: Never  ? ? ? ?Allergies   ?Penicillins ? ? ?Review of Systems ?Review of Systems ?Per HPI ? ?Physical Exam ?Triage Vital Signs ?ED Triage Vitals  ?Enc Vitals Group  ?   BP 08/16/21 1232 137/80  ?   Pulse Rate 08/16/21 1232 82  ?   Resp 08/16/21 1232 18  ?   Temp 08/16/21 1232 98.7 ?F (37.1 ?C)  ?   Temp Source 08/16/21 1232 Oral  ?   SpO2 08/16/21 1232 95 %  ?   Weight --   ?   Height --   ?   Head Circumference --   ?    Peak Flow --   ?   Pain Score 08/16/21 1234 0  ?   Pain Loc --   ?   Pain Edu? --   ?   Excl. in [place]? --   ? ?No data found. ? ?Updated Vital Signs ?BP 137/80 (BP Location: Right Arm)   Pulse 82   Temp 98.7 ?F (37.1 ?C) (Oral)   Resp 18   SpO2 95%  ? ?Visual Acuity ?Right Eye Distance:   ?Left Eye Distance:   ?Bilateral Distance:   ? ?Right Eye Near:   ?Left Eye Near:    ?Bilateral Near:    ? ?Physical Exam ?Vitals and nursing note reviewed.  ?Constitutional:   ?   Appearance: He is well-developed.  ?HENT:  ?   Head: Atraumatic.  ?   Right Ear: External ear normal.  ?   Left Ear: External ear normal.  ?   Nose: Rhinorrhea present.  ?   Mouth/Throat:  ?   Pharynx: Posterior oropharyngeal erythema present. No oropharyngeal exudate.  ?Eyes:  ?   Conjunctiva/sclera: Conjunctivae normal.  ?   Pupils: Pupils are equal, round, and reactive to light.  ?Cardiovascular:  ?  Rate and Rhythm: Normal rate and regular rhythm.  ?Pulmonary:  ?   Effort: Pulmonary effort is normal. No respiratory distress.  ?   Breath sounds: No wheezing or rales.  ?Musculoskeletal:     ?   General: Normal range of motion.  ?   Cervical back: Normal range of motion and neck supple.  ?Lymphadenopathy:  ?   Cervical: No cervical adenopathy.  ?Skin: ?   General: Skin is warm and dry.  ?Neurological:  ?   Mental Status: He is alert and oriented to person, place, and time.  ?Psychiatric:     ?   Behavior: Behavior normal.  ? ? ? ?UC Treatments / Results  ?Labs ?(all labs ordered are listed, but only abnormal results are displayed) ?Labs Reviewed - No data to display ? ?EKG ? ? ?Radiology ?No results found. ? ?Procedures ?Procedures (including critical care time) ? ?Medications Ordered in UC ?Medications - No data to display ? ?Initial Impression / Assessment and Plan / UC Course  ?I have reviewed the triage vital signs and the nursing notes. ? ?Pertinent labs & imaging results that were available during my care of the patient were reviewed by me and  considered in my medical decision making (see chart for details). ? ?  ? ?Vital signs benign and reassuring, suspect seasonal allergy exacerbation leading to bronchitis.  Treat with prednisone burst for the bronchitis, start good allergy regimen Zyrtec and Flonase.  Discussed supportive care and return precautions. ? ?Final Clinical Impressions(s) / UC Diagnoses  ? ?Final diagnoses:  ?Seasonal allergic rhinitis due to other allergic trigger  ?Acute bronchitis, unspecified organism  ? ?Discharge Instructions   ?None ?  ? ?ED Prescriptions   ? ? Medication Sig Dispense Auth. Provider  ? predniSONE (DELTASONE) 20 MG tablet Take 2 tablets (40 mg total) by mouth daily with breakfast. 10 tablet Volney American, PA-C  ? cetirizine (ZYRTEC ALLERGY) 10 MG tablet Take 1 tablet (10 mg total) by mouth daily. 30 tablet Volney American, [place]  ? fluticasone [facility]) 50 MCG/ACT nasal spray Place 1 spray into both nostrils 2 (two) times daily. 16 g Volney American, [place]  ? ?  ? ?PDMP not reviewed this encounter. ?  ?Volney American, PA-C ?08/16/21 1321 ? ?

## 2021-08-19 ENCOUNTER — Telehealth: Payer: Self-pay | Admitting: Urgent Care

## 2021-08-19 ENCOUNTER — Ambulatory Visit
Admission: EM | Admit: 2021-08-19 | Discharge: 2021-08-19 | Disposition: A | Discharge status: Home / Self Care | Payer: No Typology Code available for payment source | Attending: Urgent Care | Admitting: Urgent Care

## 2021-08-19 ENCOUNTER — Ambulatory Visit (INDEPENDENT_AMBULATORY_CARE_PROVIDER_SITE_OTHER): Payer: No Typology Code available for payment source

## 2021-08-19 DIAGNOSIS — R0602 Shortness of breath: Secondary | ICD-10-CM | POA: Diagnosis not present

## 2021-08-19 DIAGNOSIS — R5383 Other fatigue: Secondary | ICD-10-CM | POA: Diagnosis not present

## 2021-08-19 DIAGNOSIS — R5381 Other malaise: Secondary | ICD-10-CM | POA: Diagnosis not present

## 2021-08-19 DIAGNOSIS — R053 Chronic cough: Secondary | ICD-10-CM

## 2021-08-19 DIAGNOSIS — R059 Cough, unspecified: Secondary | ICD-10-CM | POA: Diagnosis not present

## 2021-08-19 MED ORDER — PSEUDOEPHEDRINE HCL 30 MG PO TABS: 30.0000 mg | ORAL_TABLET | Freq: Three times a day (TID) | ORAL | 0 refills | Status: DC | PRN

## 2021-08-19 MED ORDER — PROMETHAZINE-DM 6.25-15 MG/5ML PO SYRP: 5.0000 mL | ORAL_SOLUTION | Freq: Every evening | ORAL | 0 refills | Status: DC | PRN

## 2021-08-19 MED ORDER — LEVOCETIRIZINE DIHYDROCHLORIDE 5 MG PO TABS: 5.0000 mg | ORAL_TABLET | Freq: Every evening | ORAL | 0 refills | Status: DC

## 2021-08-19 NOTE — ED Provider Notes (Signed)
?[place] [facility] ? ? ?MRN: [id number] DOB: December 05, 1955 ? ?Subjective:  ? ?Brent Davis is a 66 y.o. male presenting for 10-day history of persistent and worsening cough, fatigue, malaise, sinus congestion, throat discomfort, labored breathing.  Patient was last seen 08/16/2021.  In the context of allergic rhinitis and bronchitis, patient was prescribed prednisone, Zyrtec and Flonase.  He was unable to tolerate prednisone and has not started the other medications for supportive care.  He is not a smoker.  No history of respiratory disorders. ? ?No current facility-administered medications for this encounter. ? ?Current Outpatient Medications:  ?  cetirizine (ZYRTEC ALLERGY) 10 MG tablet, Take 1 tablet (10 mg total) by mouth daily., Disp: 30 tablet, Rfl: 2 ?  fluticasone (FLONASE) 50 MCG/ACT nasal spray, Place 1 spray into both nostrils 2 (two) times daily., Disp: 16 g, Rfl: 2 ?  predniSONE (DELTASONE) 20 MG tablet, Take 2 tablets (40 mg total) by mouth daily with breakfast., Disp: 10 tablet, Rfl: 0  ? ?Allergies  ?Allergen Reactions  ? Penicillins   ? ? ?History reviewed. No pertinent past medical history.  ? ?Past Surgical History:  ?Procedure Laterality Date  ? BACK SURGERY    ? ? ?Family History  ?Problem Relation Age of Onset  ? Diabetes Father   ? Epilepsy Father   ? ? ?Social History  ? ?Tobacco Use  ? Smoking status: Never  ? Smokeless tobacco: Never  ?Substance Use Topics  ? Alcohol use: Yes  ? Drug use: Never  ? ? ?ROS ? ? ?Objective:  ? ?Vitals: ?BP 119/79   Pulse 74   Temp 98.4 ?F (36.9 ?C)   Resp 18   SpO2 94%  ? ?Physical Exam ?Constitutional:   ?   General: He is not in acute distress. ?   Appearance: Normal appearance. He is well-developed and normal weight. He is not ill-appearing, toxic-appearing or diaphoretic.  ?HENT:  ?   Head: Normocephalic and atraumatic.  ?   Right Ear: Tympanic membrane, ear canal and external ear normal. There is no impacted cerumen.  ?   Left Ear: Tympanic  membrane, ear canal and external ear normal. There is no impacted cerumen.  ?   Nose: Nose normal. No congestion or rhinorrhea.  ?   Mouth/Throat:  ?   Mouth: Mucous membranes are moist.  ?   Pharynx: No oropharyngeal exudate or posterior oropharyngeal erythema.  ?Eyes:  ?   General: No scleral icterus.    ?   Right eye: No discharge.     ?   Left eye: No discharge.  ?   Extraocular Movements: Extraocular movements intact.  ?   Conjunctiva/sclera: Conjunctivae normal.  ?Cardiovascular:  ?   Rate and Rhythm: Normal rate and regular rhythm.  ?   Heart sounds: Normal heart sounds. No murmur heard. ?  No friction rub. No gallop.  ?Pulmonary:  ?   Effort: Pulmonary effort is normal. No respiratory distress.  ?   Breath sounds: No stridor. Wheezing (rhonchi over lower lateral lung bases) present. No rhonchi or rales.  ?Musculoskeletal:  ?   Cervical back: Normal range of motion and neck supple. No rigidity. No muscular tenderness.  ?Neurological:  ?   General: No focal deficit present.  ?   Mental Status: He is alert and oriented to person, place, and time.  ?Psychiatric:     ?   Mood and Affect: Mood normal.     ?   Behavior: Behavior normal.     ?  Thought Content: Thought content normal.  ? ? ?Assessment and Plan :  ? ?PDMP not reviewed this encounter. ? ?1. Persistent cough   ?2. Malaise and fatigue   ? ?Unfortunately, there were technical issues and the radiologist group at the hospital was not able to see our chest x-ray ordered.  This led to a very long wait for the patient and he opted to leave.  I recommended discussing his results by phone once today obtain the over read.  He was agreeable to this.  He did emphasize that he do not longer wanted any steroids.  We will follow-up once I receive the overread. ? ?  ?Jaynee Eagles, PA-C ?08/19/21 1426 ? ?

## 2021-08-19 NOTE — Telephone Encounter (Signed)
DG Chest 2 View ? ?Result Date: 08/19/2021 ?CLINICAL DATA:  Shortness of breath, cough EXAM: CHEST - 2 VIEW COMPARISON:  06/22/2019 FINDINGS: Cardiac size is within normal limits. Increase in AP diameter of chest suggests COPD. There are no signs of pulmonary edema or focal pulmonary consolidation. There is no pleural effusion or pneumothorax. Old healed fractures are seen in multiple right ribs. IMPRESSION: COPD. There are no signs of pulmonary edema or new focal infiltrates. Electronically Signed   By: Elmer Picker M.D.   On: 08/19/2021 14:30   ? ?Called to report results with patient and discussed them. Patient was very frustrated that he could not tolerate the steroids and he is still coughing. I advised cough suppression medications.  Also recommended Zyrtec and pseudoephedrine since he could not tolerate steroids.  Will defer further antibiotic use as his chest x-ray was negative for pneumonia.  I recommended patient's seek a second opinion elsewhere if he continues to have symptoms as he has not been pleased with the care that we are providing with 2 visits at our clinic now. ?

## 2021-08-19 NOTE — ED Triage Notes (Signed)
Pt returns for cough and symptoms of uri, was seen on Friday and given prednisone and states he quit taking due to diarrhea ?

## 2021-10-14 DIAGNOSIS — M542 Cervicalgia: Secondary | ICD-10-CM | POA: Diagnosis not present

## 2021-10-14 DIAGNOSIS — M9901 Segmental and somatic dysfunction of cervical region: Secondary | ICD-10-CM | POA: Diagnosis not present

## 2021-10-14 DIAGNOSIS — M9907 Segmental and somatic dysfunction of upper extremity: Secondary | ICD-10-CM | POA: Diagnosis not present

## 2021-10-14 DIAGNOSIS — M25512 Pain in left shoulder: Secondary | ICD-10-CM | POA: Diagnosis not present

## 2021-10-14 DIAGNOSIS — M546 Pain in thoracic spine: Secondary | ICD-10-CM | POA: Diagnosis not present

## 2021-10-14 DIAGNOSIS — M9902 Segmental and somatic dysfunction of thoracic region: Secondary | ICD-10-CM | POA: Diagnosis not present

## 2021-11-01 DIAGNOSIS — M542 Cervicalgia: Secondary | ICD-10-CM | POA: Diagnosis not present

## 2021-11-01 DIAGNOSIS — M9907 Segmental and somatic dysfunction of upper extremity: Secondary | ICD-10-CM | POA: Diagnosis not present

## 2021-11-01 DIAGNOSIS — M25512 Pain in left shoulder: Secondary | ICD-10-CM | POA: Diagnosis not present

## 2021-11-01 DIAGNOSIS — M546 Pain in thoracic spine: Secondary | ICD-10-CM | POA: Diagnosis not present

## 2021-11-01 DIAGNOSIS — M9901 Segmental and somatic dysfunction of cervical region: Secondary | ICD-10-CM | POA: Diagnosis not present

## 2021-11-01 DIAGNOSIS — M9902 Segmental and somatic dysfunction of thoracic region: Secondary | ICD-10-CM | POA: Diagnosis not present

## 2021-11-06 DIAGNOSIS — M9901 Segmental and somatic dysfunction of cervical region: Secondary | ICD-10-CM | POA: Diagnosis not present

## 2021-11-06 DIAGNOSIS — M25512 Pain in left shoulder: Secondary | ICD-10-CM | POA: Diagnosis not present

## 2021-11-06 DIAGNOSIS — M9902 Segmental and somatic dysfunction of thoracic region: Secondary | ICD-10-CM | POA: Diagnosis not present

## 2021-11-06 DIAGNOSIS — M546 Pain in thoracic spine: Secondary | ICD-10-CM | POA: Diagnosis not present

## 2021-11-06 DIAGNOSIS — M9907 Segmental and somatic dysfunction of upper extremity: Secondary | ICD-10-CM | POA: Diagnosis not present

## 2021-11-06 DIAGNOSIS — M542 Cervicalgia: Secondary | ICD-10-CM | POA: Diagnosis not present

## 2021-11-13 DIAGNOSIS — M546 Pain in thoracic spine: Secondary | ICD-10-CM | POA: Diagnosis not present

## 2021-11-13 DIAGNOSIS — M9901 Segmental and somatic dysfunction of cervical region: Secondary | ICD-10-CM | POA: Diagnosis not present

## 2021-11-13 DIAGNOSIS — M25512 Pain in left shoulder: Secondary | ICD-10-CM | POA: Diagnosis not present

## 2021-11-13 DIAGNOSIS — M542 Cervicalgia: Secondary | ICD-10-CM | POA: Diagnosis not present

## 2021-11-13 DIAGNOSIS — M9907 Segmental and somatic dysfunction of upper extremity: Secondary | ICD-10-CM | POA: Diagnosis not present

## 2021-11-13 DIAGNOSIS — M9902 Segmental and somatic dysfunction of thoracic region: Secondary | ICD-10-CM | POA: Diagnosis not present

## 2021-11-22 DIAGNOSIS — M542 Cervicalgia: Secondary | ICD-10-CM | POA: Diagnosis not present

## 2021-11-22 DIAGNOSIS — M9902 Segmental and somatic dysfunction of thoracic region: Secondary | ICD-10-CM | POA: Diagnosis not present

## 2021-11-22 DIAGNOSIS — M25512 Pain in left shoulder: Secondary | ICD-10-CM | POA: Diagnosis not present

## 2021-11-22 DIAGNOSIS — M9901 Segmental and somatic dysfunction of cervical region: Secondary | ICD-10-CM | POA: Diagnosis not present

## 2021-11-22 DIAGNOSIS — M546 Pain in thoracic spine: Secondary | ICD-10-CM | POA: Diagnosis not present

## 2021-11-22 DIAGNOSIS — M9907 Segmental and somatic dysfunction of upper extremity: Secondary | ICD-10-CM | POA: Diagnosis not present

## 2021-11-27 ENCOUNTER — Other Ambulatory Visit: Payer: Self-pay | Admitting: Family Medicine

## 2021-12-02 ENCOUNTER — Encounter (INDEPENDENT_AMBULATORY_CARE_PROVIDER_SITE_OTHER): Payer: Self-pay | Admitting: *Deleted

## 2021-12-02 ENCOUNTER — Ambulatory Visit (INDEPENDENT_AMBULATORY_CARE_PROVIDER_SITE_OTHER): Payer: No Typology Code available for payment source | Admitting: Family Medicine

## 2021-12-02 ENCOUNTER — Encounter: Payer: Self-pay | Admitting: Family Medicine

## 2021-12-02 VITALS — BP 125/80 | HR 73 | Temp 97.7°F | Ht 71.0 in | Wt 217.0 lb

## 2021-12-02 DIAGNOSIS — H9191 Unspecified hearing loss, right ear: Secondary | ICD-10-CM | POA: Diagnosis not present

## 2021-12-02 DIAGNOSIS — Z130 Encounter for screening for diseases of the blood and blood-forming organs and certain disorders involving the immune mechanism: Secondary | ICD-10-CM

## 2021-12-02 DIAGNOSIS — M1711 Unilateral primary osteoarthritis, right knee: Secondary | ICD-10-CM | POA: Insufficient documentation

## 2021-12-02 DIAGNOSIS — Z125 Encounter for screening for malignant neoplasm of prostate: Secondary | ICD-10-CM | POA: Diagnosis not present

## 2021-12-02 DIAGNOSIS — Z1211 Encounter for screening for malignant neoplasm of colon: Secondary | ICD-10-CM | POA: Diagnosis not present

## 2021-12-02 DIAGNOSIS — E669 Obesity, unspecified: Secondary | ICD-10-CM

## 2021-12-02 DIAGNOSIS — Z0000 Encounter for general adult medical examination without abnormal findings: Secondary | ICD-10-CM | POA: Diagnosis not present

## 2021-12-02 DIAGNOSIS — Z13220 Encounter for screening for lipoid disorders: Secondary | ICD-10-CM | POA: Diagnosis not present

## 2021-12-02 NOTE — Assessment & Plan Note (Signed)
Exam normal. Concern for hearing loss. Referring to ENT.

## 2021-12-02 NOTE — Progress Notes (Signed)
Subjective:  Patient ID: Brent Davis, male    DOB: 08/26/1955  Age: 66 y.o. MRN: [id number]  CC: Chief Complaint  Patient presents with   New Patient (Initial Visit)    Needs ear cleaned    HPI:  66 year old male with a history of OA of the right knee presents to establish care.  Patient reports that he has had ongoing issues with hearing (right ear). Reports there is an echo. Often has difficulty hearing his wife when she speaks in a low voice.  He is concerned that he has a cerumen impaction.  He would like me to examine his right ear today.  He is otherwise feeling well has no other complaints.    Patient is overdue for colonoscopy.  Does not desire any further COVID vaccinations.  Declines pneumococcal vaccination.  No recent labs available.  PMH, Surgical Hx, Family Hx, Social History reviewed and updated as below.  Past Medical History:  Diagnosis Date   Osteoarthritis of right knee    Past Surgical History:  Procedure Laterality Date   BACK SURGERY     Family History  Problem Relation Age of Onset   Diabetes Father    Epilepsy Father    Social History   Socioeconomic History   Marital status: Married    Spouse name: Not on file   Number of children: Not on file   Years of education: Not on file   Highest education level: Not on file  Occupational History   Not on file  Tobacco Use   Smoking status: Never   Smokeless tobacco: Never  Substance and Sexual Activity   Alcohol use: Yes    Comment: 2-3/week   Drug use: Never   Sexual activity: Not on file  Other Topics Concern   Not on file  Social History Narrative   Not on file   Social Determinants of Health   Financial Resource Strain: Not on file  Food Insecurity: Not on file  Transportation Needs: Not on file  Physical Activity: Not on file  Stress: Not on file  Social Connections: Not on file   Review of Systems Per HPI  Objective:  BP 125/80   Pulse 73   Temp 97.7 F (36.5 C)  (Oral)   Ht 5' 11"  (1.803 m)   Wt 217 lb (98.4 kg)   SpO2 100%   BMI 30.27 kg/m      12/02/2021   10:47 AM 08/19/2021    1:00 PM 08/16/2021   12:32 PM  BP/Weight  Systolic BP 893 734 287  Diastolic BP 80 79 80  Wt. (Lbs) 217    BMI 30.27 kg/m2      Physical Exam Constitutional:      General: He is not in acute distress.    Appearance: Normal appearance.  HENT:     Head: Normocephalic and atraumatic.     Right Ear: Tympanic membrane normal.     Left Ear: Tympanic membrane normal.     Mouth/Throat:     Pharynx: Oropharynx is clear.  Eyes:     General:        Right eye: No discharge.        Left eye: No discharge.     Conjunctiva/sclera: Conjunctivae normal.  Cardiovascular:     Rate and Rhythm: Normal rate and regular rhythm.  Pulmonary:     Effort: Pulmonary effort is normal.     Breath sounds: No wheezing or rales.  Abdominal:  General: There is no distension.     Palpations: Abdomen is soft.     Tenderness: There is no abdominal tenderness.  Neurological:     General: No focal deficit present.     Mental Status: He is alert.  Psychiatric:        Mood and Affect: Mood normal.        Behavior: Behavior normal.     Assessment & Plan:   Problem List Items Addressed This Visit       Nervous and Auditory   Hearing deficit, right - Primary    Exam normal. Concern for hearing loss. Referring to ENT.       Relevant Orders   Ambulatory referral to ENT     Musculoskeletal and Integument   Osteoarthritis of right knee     Other   Preventative health care    Screening labs today. Placing referral for colonoscopy. Declines pneumococcal vaccine. Declines additional COVID vaccinations.      Other Visit Diagnoses     Encounter for screening colonoscopy       Relevant Orders   Ambulatory referral to Gastroenterology   Screening for deficiency anemia       Relevant Orders   CBC   Obesity (BMI 30.0-34.9)       Relevant Orders   CMP14+EGFR    Screening for lipid disorders       Relevant Orders   Lipid panel   Screening PSA (prostate specific antigen)       Relevant Orders   PSA       Follow-up:  Return in about 1 year (around 12/03/2022).  [place]

## 2021-12-02 NOTE — Patient Instructions (Signed)
I have placed the referral to GI as well as ENT.  If you have not heard from them in the next 2 weeks, please let me know.  Labs ordered.  You can do them when you like.  Follow up annually.

## 2021-12-02 NOTE — Assessment & Plan Note (Signed)
Screening labs today. Placing referral for colonoscopy. Declines pneumococcal vaccine. Declines additional COVID vaccinations.

## 2021-12-03 LAB — CMP14+EGFR
ALT: 20 IU/L (ref 0–44)
AST: 23 IU/L (ref 0–40)
Albumin/Globulin Ratio: 1.6 (ref 1.2–2.2)
Albumin: 4.1 g/dL (ref 3.9–4.9)
Alkaline Phosphatase: 82 IU/L (ref 44–121)
BUN/Creatinine Ratio: 15 (ref 10–24)
BUN: 15 mg/dL (ref 8–27)
Bilirubin Total: 0.4 mg/dL (ref 0.0–1.2)
CO2: 26 mmol/L (ref 20–29)
Calcium: 9 mg/dL (ref 8.6–10.2)
Chloride: 101 mmol/L (ref 96–106)
Creatinine, Ser: 1.01 mg/dL (ref 0.76–1.27)
Globulin, Total: 2.5 g/dL (ref 1.5–4.5)
Glucose: 86 mg/dL (ref 70–99)
Potassium: 5.1 mmol/L (ref 3.5–5.2)
Sodium: 139 mmol/L (ref 134–144)
Total Protein: 6.6 g/dL (ref 6.0–8.5)
eGFR: 82 mL/min/{1.73_m2} (ref >59)

## 2021-12-03 LAB — CBC
Hematocrit: 46 % (ref 37.5–51.0)
Hemoglobin: 15.5 g/dL (ref 13.0–17.7)
MCH: 30.9 pg (ref 26.6–33.0)
MCHC: 33.7 g/dL (ref 31.5–35.7)
MCV: 92 fL (ref 79–97)
Platelets: 217 10*3/uL (ref 150–450)
RBC: 5.02 x10E6/uL (ref 4.14–5.80)
RDW: 13.3 % (ref 11.6–15.4)
WBC: 7.7 10*3/uL (ref 3.4–10.8)

## 2021-12-03 LAB — PSA: Prostate Specific Ag, Serum: 0.5 ng/mL (ref 0.0–4.0)

## 2021-12-03 LAB — LIPID PANEL
Chol/HDL Ratio: 2.5 ratio (ref 0.0–5.0)
Cholesterol, Total: 157 mg/dL (ref 100–199)
HDL: 62 mg/dL (ref >39)
LDL Chol Calc (NIH): 81 mg/dL (ref 0–99)
Triglycerides: 74 mg/dL (ref 0–149)
VLDL Cholesterol Cal: 14 mg/dL (ref 5–40)

## 2022-01-09 ENCOUNTER — Other Ambulatory Visit: Payer: Self-pay | Admitting: Family Medicine

## 2022-01-10 NOTE — Telephone Encounter (Signed)
Provider not at this practice, will refuse this request.  Requested Prescriptions  Pending Prescriptions Disp Refills  . fluticasone (FLONASE) 50 MCG/ACT nasal spray [Pharmacy Med Name: FLUTICASONE PROP 50 MCG SPRAY] 16 mL 2    Sig: PLACE 1 SPRAY INTO BOTH NOSTRILS 2 (TWO) TIMES DAILY     There is no refill protocol information for this order

## 2022-01-30 DIAGNOSIS — H93293 Other abnormal auditory perceptions, bilateral: Secondary | ICD-10-CM | POA: Diagnosis not present

## 2022-03-24 DIAGNOSIS — H903 Sensorineural hearing loss, bilateral: Secondary | ICD-10-CM | POA: Diagnosis not present

## 2022-03-31 ENCOUNTER — Ambulatory Visit (INDEPENDENT_AMBULATORY_CARE_PROVIDER_SITE_OTHER): Payer: No Typology Code available for payment source | Admitting: Family Medicine

## 2022-03-31 ENCOUNTER — Encounter: Payer: Self-pay | Admitting: Family Medicine

## 2022-03-31 VITALS — BP 133/84 | HR 61 | Temp 97.8°F | Ht 67.72 in | Wt 225.0 lb

## 2022-03-31 DIAGNOSIS — Z0000 Encounter for general adult medical examination without abnormal findings: Secondary | ICD-10-CM | POA: Diagnosis not present

## 2022-03-31 IMAGING — DX DG KNEE COMPLETE 4+V*R*
4 series · 4 of 4 positions shown · non-contrast
Comparison: None.

CLINICAL DATA: Right knee pain and swelling after injury last week

EXAM:
RIGHT KNEE - COMPLETE 4+ VIEW

[knee ap]
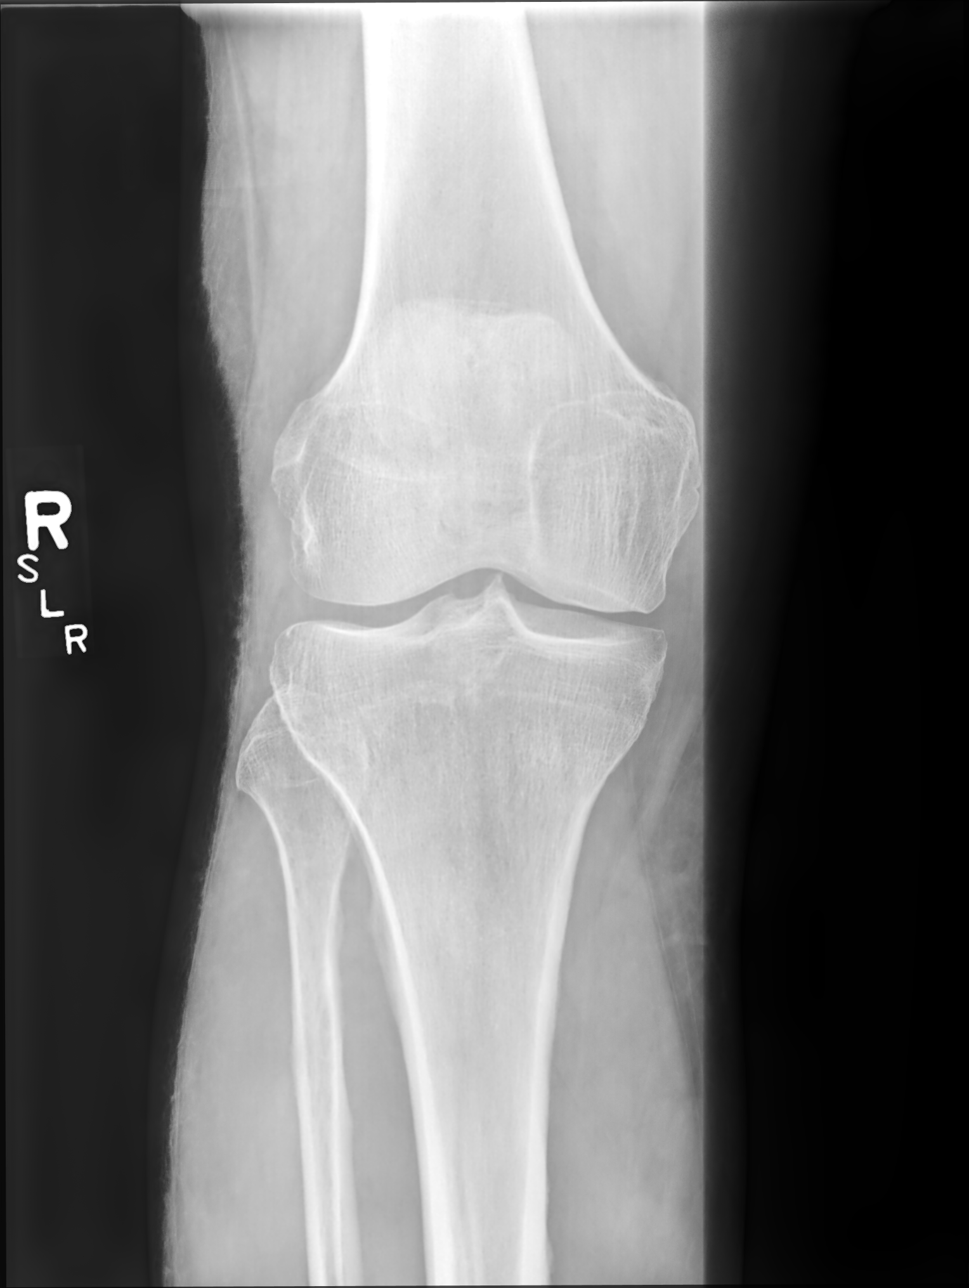

[knee mlo (1 of 2)]
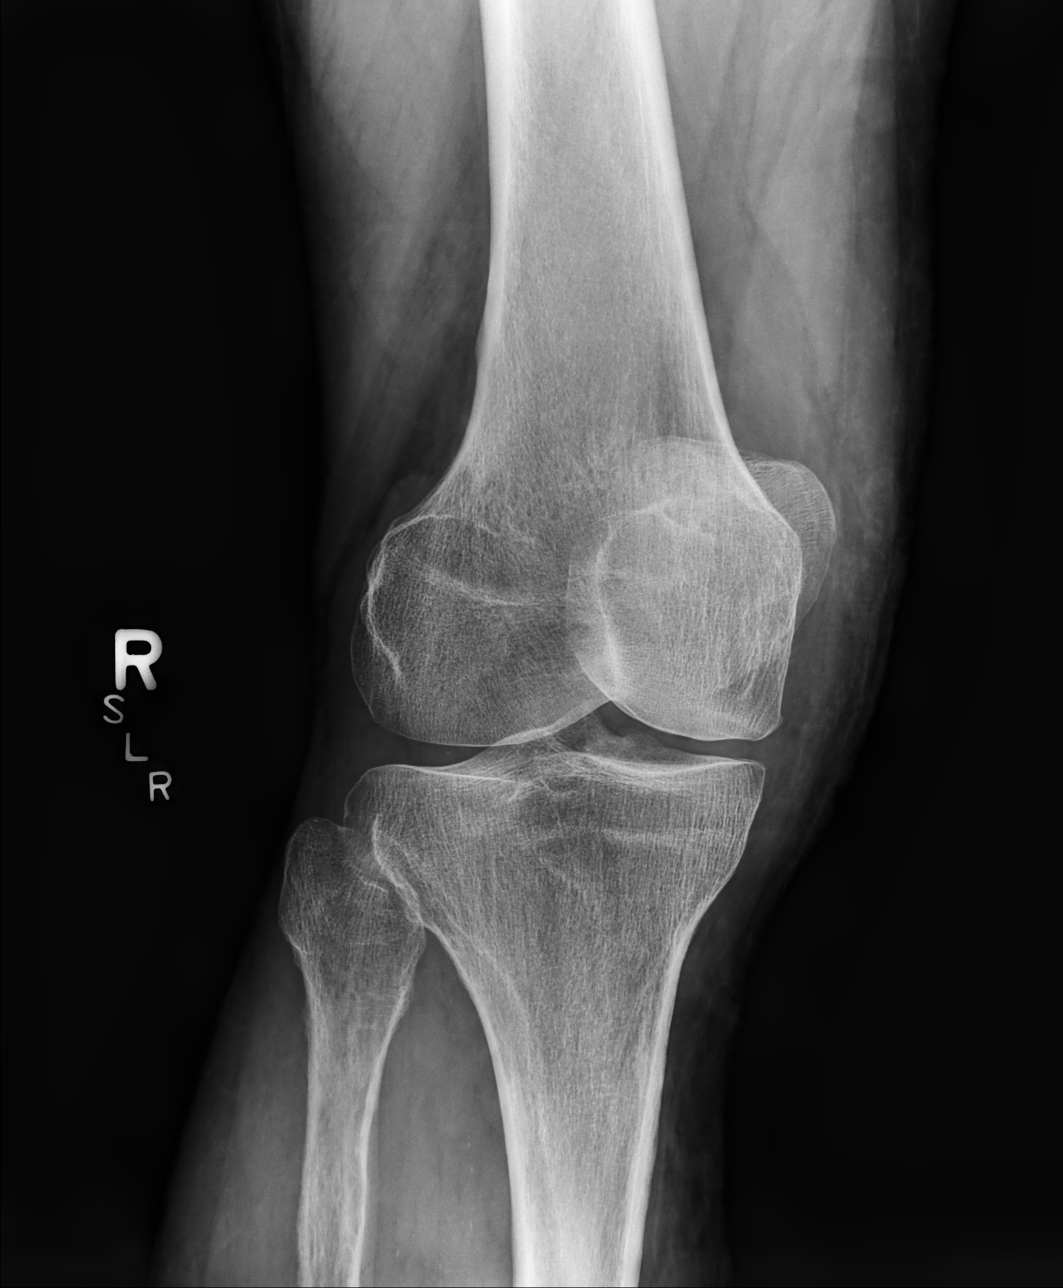

[knee mlo (2 of 2)]
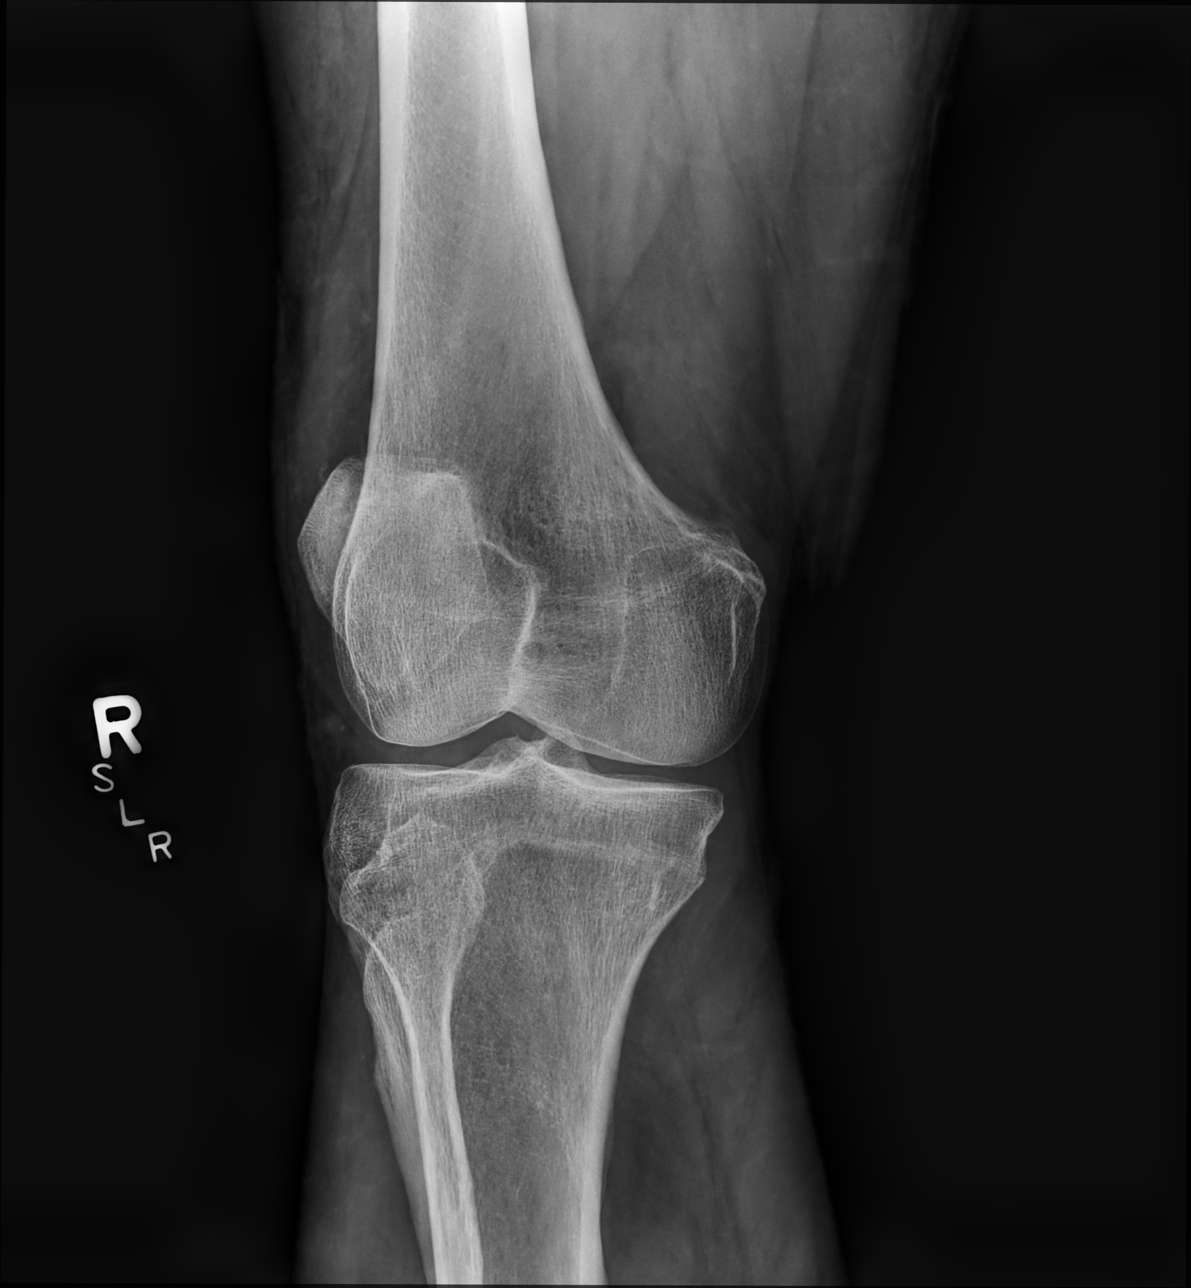

[knee lat]
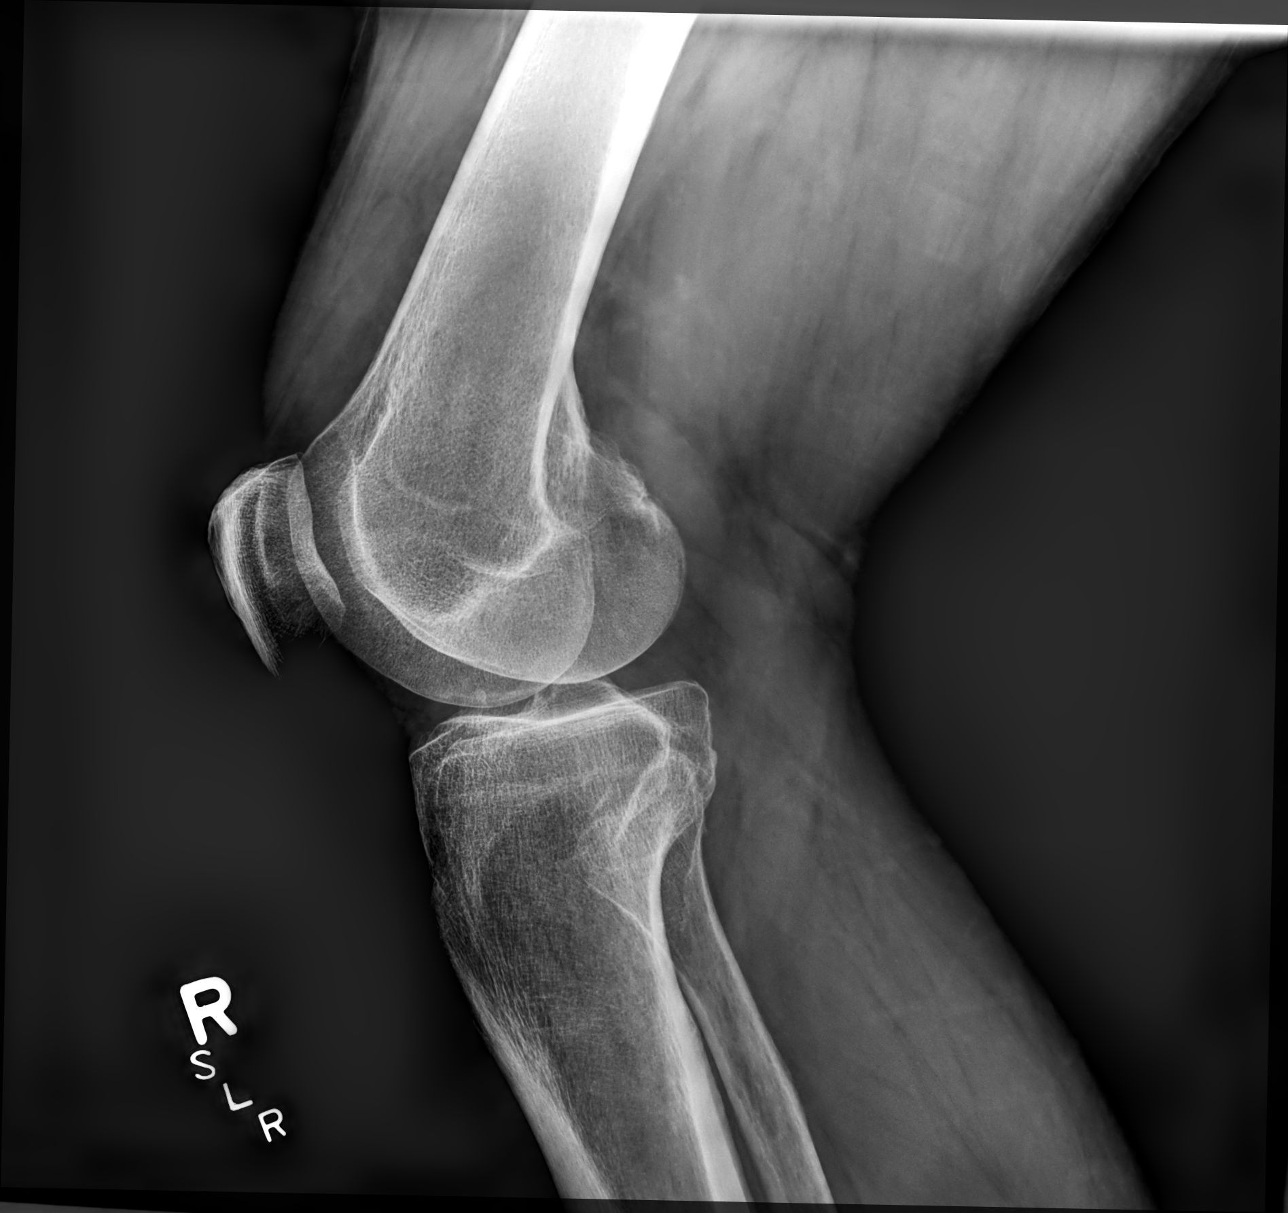

[4 of 4 positions shown; findings below may reference images not displayed]

FINDINGS: No evidence of acute fracture or malalignment. Small knee joint
effusion. Mild tricompartmental joint space narrowing. No
appreciable soft tissue swelling. Lateral view is slightly limited
by technical over-penetration.
IMPRESSION: 1. Small knee joint effusion. No acute fracture or malalignment.
2. Mild tricompartmental joint space narrowing.

## 2022-03-31 NOTE — Progress Notes (Signed)
Subjective:   Brent Davis is a 66 y.o. male who presents for a Welcome to Medicare exam.   Review of Systems: Denies chest pain, SOB. He is feeling well.        Objective:    Today's Vitals   03/31/22 1519 03/31/22 1528  BP: (!) 143/78 133/84  Pulse: 61   Temp: 97.8 F (36.6 C)   TempSrc: Oral   SpO2: 95%   Weight: 225 lb (102.1 kg)   Height: 5' 7.72" (1.72 m)    Body mass index is 34.5 kg/m.  Medications Outpatient Encounter Medications as of 03/31/2022  Medication Sig   diphenhydrAMINE (BENADRYL) 25 mg capsule Take 25 mg by mouth.   cetirizine (ZYRTEC ALLERGY) 10 MG tablet Take 1 tablet (10 mg total) by mouth daily. (Patient not taking: Reported on 03/31/2022)   fluticasone (FLONASE) 50 MCG/ACT nasal spray Place 1 spray into both nostrils 2 (two) times daily. (Patient not taking: Reported on 03/31/2022)   No facility-administered encounter medications on file as of 03/31/2022.     History: Past Medical History:  Diagnosis Date   Osteoarthritis of right knee    Past Surgical History:  Procedure Laterality Date   BACK SURGERY      Family History  Problem Relation Age of Onset   Diabetes Father    Epilepsy Father    Social History   Occupational History   Not on file  Tobacco Use   Smoking status: Never   Smokeless tobacco: Never  Substance and Sexual Activity   Alcohol use: Yes    Comment: 2-3/week   Drug use: Never   Sexual activity: Not on file   Immunizations and Health Maintenance  Health Maintenance Due  Topic Date Due   Hepatitis C Screening  Never done   COLONOSCOPY (Pts 45-41yr Insurance coverage will need to be confirmed)  Never done    Activities of Daily Living No issues or concerns regarding ability to perform ADL's.  Physical Exam: Physical Exam   Gen.: Well-appearing male in NAD.  HENT: Normocephalic atraumatic. Normal TMs bilaterally. Oropharynx clear.  Eyes: No scleral icterus. Normal conjunctiva. Neck: Supple. No  thyromegaly or adenopathy. Heart: Regular rate and rhythm. No murmurs appreciated. No lower extremity edema. Lungs: Clear auscultation bilaterally. No rales, rhonchi, or wheezing. Abdomen: Soft, nontender, nondistended. No palpable organomegaly. No rebound or guarding. MSK: Normal range of motion. Neuro: No focal deficits. Psych: Normal mood and affect.        Assessment:    This is a routine wellness  examination for this patient .  Vision/Hearing screen Needs eye exam.  Has seen ENT regarding hearing loss. Upcoming visit for hearing aids.  Dietary issues and exercise activities discussed:  Advised healthy diet and to stay active.  Depression Screen    03/31/2022    3:24 PM  PHQ 2/9 Scores  PHQ - 2 Score 0     Fall Risk    03/31/2022    3:24 PM  Fall Risk   Falls in the past year? 0  Number falls in past yr: 0  Injury with Fall? 0    Cognitive Function Normal cognitive function.  Patient Care Team: CCoral Spikes DO as PCP - General (Family Medicine)     Plan:   I have personally reviewed and noted the following in the patient's chart:   Medical and social history Use of alcohol, tobacco or illicit drugs  Current medications and supplements Functional ability and status Nutritional status Physical  activity Advanced directives List of other physicians Hospitalizations, surgeries, and ER visits in previous 12 months Vitals Screenings to include cognitive, depression, and falls Referrals and appointments  In addition, I have reviewed and discussed with patient certain preventive protocols, quality metrics, and best practice recommendations    Coral Spikes, DO 03/31/2022

## 2022-03-31 NOTE — Patient Instructions (Signed)
You're doing well.  Follow up annually.  Take care  Dr. Lacinda Axon

## 2022-04-09 DIAGNOSIS — H903 Sensorineural hearing loss, bilateral: Secondary | ICD-10-CM | POA: Insufficient documentation

## 2022-04-10 ENCOUNTER — Telehealth (INDEPENDENT_AMBULATORY_CARE_PROVIDER_SITE_OTHER): Payer: Self-pay | Admitting: *Deleted

## 2022-04-10 NOTE — Telephone Encounter (Signed)
Any room

## 2022-04-10 NOTE — Telephone Encounter (Signed)
Referring MD/PCP: Thersa Salt  Procedure: Colonoscopy  Reason/Indication:  screening   Has patient had this procedure before?  no  If so, when, by whom and where?    Is there a family history of colon cancer?  no  Who?  What age when diagnosed?    Is patient diabetic? If yes, Type 1 or Type 2   no      Does patient have prosthetic heart valve or mechanical valve?  no  Do you have a pacemaker/defibrillator?  no  Has patient ever had endocarditis/atrial fibrillation? no  Does patient use oxygen? no  Has patient had joint replacement within last 12 months?  no  Is patient constipated or do they take laxatives? no  Does patient have a history of alcohol/drug use?  no  Have you had a stroke/heart attack last 6 mths? no  Do you take medicine for weight loss?  no  Is patient on blood thinner such as Coumadin, Plavix and/or Aspirin? no  Medications:  Current Outpatient Medications on File Prior to Visit  Medication Sig Dispense Refill   diphenhydrAMINE (BENADRYL) 25 mg capsule Take 25 mg by mouth.     fluticasone (FLONASE) 50 MCG/ACT nasal spray Place 1 spray into both nostrils 2 (two) times daily. 16 g 2   No current facility-administered medications on file prior to visit.     Allergies:  Allergies  Allergen Reactions   Penicillins

## 2022-04-11 MED ORDER — PEG 3350-KCL-NA BICARB-NACL 420 G PO SOLR: 4000.0000 mL | Freq: Once | ORAL | 0 refills | Status: AC

## 2022-04-11 NOTE — Addendum Note (Signed)
Addended by: Cheron Every on: 04/11/2022 10:12 AM   Modules accepted: Orders

## 2022-04-11 NOTE — Telephone Encounter (Signed)
Called pt. Wanted done this year. Scheduled for 12/22 at 7:30am. Aware will send instructions and prep to pharmacy.

## 2022-04-16 NOTE — Telephone Encounter (Signed)
Referral completed

## 2022-04-18 DIAGNOSIS — H903 Sensorineural hearing loss, bilateral: Secondary | ICD-10-CM | POA: Diagnosis not present

## 2022-05-02 ENCOUNTER — Ambulatory Visit (HOSPITAL_COMMUNITY)
Admission: RE | Admit: 2022-05-02 | Discharge: 2022-05-02 | Disposition: A | Discharge status: Home / Self Care | Payer: No Typology Code available for payment source | Attending: Gastroenterology | Admitting: Gastroenterology

## 2022-05-02 ENCOUNTER — Other Ambulatory Visit: Payer: Self-pay

## 2022-05-02 ENCOUNTER — Ambulatory Visit (HOSPITAL_COMMUNITY): Payer: No Typology Code available for payment source | Admitting: Anesthesiology

## 2022-05-02 ENCOUNTER — Encounter (HOSPITAL_COMMUNITY): Payer: Self-pay | Admitting: Gastroenterology

## 2022-05-02 ENCOUNTER — Ambulatory Visit (HOSPITAL_BASED_OUTPATIENT_CLINIC_OR_DEPARTMENT_OTHER): Payer: No Typology Code available for payment source | Admitting: Anesthesiology

## 2022-05-02 ENCOUNTER — Encounter (HOSPITAL_COMMUNITY)
Admission: RE | Disposition: A | Discharge status: Home / Self Care | Payer: Self-pay | Source: Home / Self Care | Attending: Gastroenterology

## 2022-05-02 DIAGNOSIS — K635 Polyp of colon: Secondary | ICD-10-CM

## 2022-05-02 DIAGNOSIS — D122 Benign neoplasm of ascending colon: Secondary | ICD-10-CM | POA: Diagnosis not present

## 2022-05-02 DIAGNOSIS — K5140 Inflammatory polyps of colon without complications: Secondary | ICD-10-CM | POA: Diagnosis not present

## 2022-05-02 DIAGNOSIS — Z1211 Encounter for screening for malignant neoplasm of colon: Secondary | ICD-10-CM | POA: Diagnosis not present

## 2022-05-02 HISTORY — PX: POLYPECTOMY: SHX149

## 2022-05-02 HISTORY — PX: COLONOSCOPY WITH PROPOFOL: SHX5780

## 2022-05-02 LAB — HM COLONOSCOPY

## 2022-05-02 SURGERY — COLONOSCOPY WITH PROPOFOL
Anesthesia: General

## 2022-05-02 MED ORDER — PROPOFOL 500 MG/50ML IV EMUL
INTRAVENOUS | Status: DC | PRN
  Administered 2022-05-02: 200 ug/kg/min via INTRAVENOUS

## 2022-05-02 MED ORDER — LACTATED RINGERS IV SOLN: INTRAVENOUS | Status: DC

## 2022-05-02 MED ORDER — LACTATED RINGERS IV SOLN: INTRAVENOUS | Status: DC | PRN

## 2022-05-02 MED ORDER — STERILE WATER FOR IRRIGATION IR SOLN
Status: DC | PRN
  Administered 2022-05-02 (×2): 60 mL

## 2022-05-02 MED ORDER — PROPOFOL 10 MG/ML IV BOLUS
INTRAVENOUS | Status: DC | PRN
  Administered 2022-05-02: 100 mg via INTRAVENOUS

## 2022-05-02 MED ORDER — LIDOCAINE HCL (CARDIAC) PF 100 MG/5ML IV SOSY
PREFILLED_SYRINGE | INTRAVENOUS | Status: DC | PRN
  Administered 2022-05-02: 50 mg via INTRATRACHEAL

## 2022-05-02 NOTE — Anesthesia Postprocedure Evaluation (Signed)
Anesthesia Post Note  Patient: Brent Davis  Procedure(s) Performed: COLONOSCOPY WITH PROPOFOL POLYPECTOMY INTESTINAL  Patient location during evaluation: Phase II Anesthesia Type: General Level of consciousness: awake Pain management: pain level controlled Vital Signs Assessment: post-procedure vital signs reviewed and stable Respiratory status: spontaneous breathing and respiratory function stable Cardiovascular status: blood pressure returned to baseline and stable Postop Assessment: no headache and no apparent nausea or vomiting Anesthetic complications: no Comments: Late entry   No notable events documented.   Last Vitals:  Vitals:   05/02/22 0755 05/02/22 0758  BP: (!) 93/45 98/67  Pulse: 63 65  Resp: 16 16  Temp: 36.4 C   SpO2: 95%     Last Pain:  Vitals:   05/02/22 0755  TempSrc: Oral  PainSc: 0-No pain                 Louann Sjogren

## 2022-05-02 NOTE — Discharge Instructions (Signed)
You are being discharged to home.  Resume your previous diet.  We are waiting for your pathology results.  Your physician has recommended a repeat colonoscopy for surveillance based on pathology results.  

## 2022-05-02 NOTE — Anesthesia Preprocedure Evaluation (Signed)
Anesthesia Evaluation  Patient identified by MRN, date of birth, ID band Patient awake    Reviewed: Allergy & Precautions, H&P , NPO status , Patient's Chart, lab work & pertinent test results, reviewed documented beta blocker date and time   Airway Mallampati: II  TM Distance: >3 FB Neck ROM: full    Dental no notable dental hx.    Pulmonary neg pulmonary ROS   Pulmonary exam normal breath sounds clear to auscultation       Cardiovascular Exercise Tolerance: Good negative cardio ROS  Rhythm:regular Rate:Normal     Neuro/Psych negative neurological ROS  negative psych ROS   GI/Hepatic negative GI ROS, Neg liver ROS,,,  Endo/Other  negative endocrine ROS    Renal/GU negative Renal ROS  negative genitourinary   Musculoskeletal   Abdominal   Peds  Hematology negative hematology ROS (+)   Anesthesia Other Findings   Reproductive/Obstetrics negative OB ROS                             Anesthesia Physical Anesthesia Plan  ASA: 2  Anesthesia Plan: General   Post-op Pain Management:    Induction:   PONV Risk Score and Plan: Propofol infusion  Airway Management Planned:   Additional Equipment:   Intra-op Plan:   Post-operative Plan:   Informed Consent: I have reviewed the patients History and Physical, chart, labs and discussed the procedure including the risks, benefits and alternatives for the proposed anesthesia with the patient or authorized representative who has indicated his/her understanding and acceptance.     Dental Advisory Given  Plan Discussed with: CRNA  Anesthesia Plan Comments:        Anesthesia Quick Evaluation  

## 2022-05-02 NOTE — Transfer of Care (Signed)
Immediate Anesthesia Transfer of Care Note  Patient: Brent Davis  Procedure(s) Performed: COLONOSCOPY WITH PROPOFOL POLYPECTOMY INTESTINAL  Patient Location: Endoscopy Unit  Anesthesia Type:General  Level of Consciousness: awake, alert , oriented, and patient cooperative  Airway & Oxygen Therapy: Patient Spontanous Breathing  Post-op Assessment: Report given to RN, Post -op Vital signs reviewed and stable, and Patient moving all extremities  Post vital signs: Reviewed and stable  Last Vitals:  Vitals Value Taken Time  BP    Temp    Pulse    Resp    SpO2      Last Pain:  Vitals:   05/02/22 0728  TempSrc:   PainSc: 0-No pain      Patients Stated Pain Goal: 8 (85/88/50 2774)  Complications: No notable events documented.

## 2022-05-02 NOTE — H&P (Signed)
Brent Davis is an 66 y.o. male.   Chief Complaint: screening colonoscopy HPI: 66 y/o M, coming for screening colonoscopy. The patient has never had a colonoscopy in the past.  The patient denies having any complaints such as melena, hematochezia, abdominal pain or distention, change in her bowel movement consistency or frequency, no changes in weight recently.  No family history of colorectal cancer.   Past Medical History:  Diagnosis Date   Osteoarthritis of right knee     Past Surgical History:  Procedure Laterality Date   BACK SURGERY      Family History  Problem Relation Age of Onset   Diabetes Father    Epilepsy Father    Social History:  reports that he has never smoked. He has never used smokeless tobacco. He reports current alcohol use. He reports that he does not use drugs.  Allergies:  Allergies  Allergen Reactions   Penicillins Swelling    Medications Prior to Admission  Medication Sig Dispense Refill   diphenhydrAMINE (BENADRYL) 25 mg capsule Take 25 mg by mouth daily as needed (runny nose/allergies.).     fluticasone (FLONASE) 50 MCG/ACT nasal spray Place 1 spray into both nostrils 2 (two) times daily. (Patient taking differently: Place 1 spray into both nostrils 2 (two) times daily as needed for allergies.) 16 g 2    No results found for this or any previous visit (from the past 48 hour(s)). No results found.  Review of Systems  All other systems reviewed and are negative.   Blood pressure (!) 148/81, pulse 69, temperature 97.6 F (36.4 C), temperature source Oral, resp. rate 16, height '5\' 10"'$  (1.778 m), weight 99.8 kg, SpO2 97 %. Physical Exam  GENERAL: The patient is AO x3, in no acute distress. HEENT: Head is normocephalic and atraumatic. EOMI are intact. Mouth is well hydrated and without lesions. NECK: Supple. No masses LUNGS: Clear to auscultation. No presence of rhonchi/wheezing/rales. Adequate chest expansion HEART: RRR, normal s1 and  s2. ABDOMEN: Soft, nontender, no guarding, no peritoneal signs, and nondistended. BS +. No masses. EXTREMITIES: Without any cyanosis, clubbing, rash, lesions or edema. NEUROLOGIC: AOx3, no focal motor deficit. SKIN: no jaundice, no rashes  Assessment/Plan 66 y/o M, coming for screening colonoscopy. The patient is at average risk for colorectal cancer.  We will proceed with colonoscopy today.   Harvel Quale, MD 05/02/2022, 7:26 AM

## 2022-05-02 NOTE — Op Note (Signed)
[facility] Patient Name: Brent Davis Procedure Date: 05/02/2022 6:58 AM MRN: [id number] Date of Birth: 1956-02-08 Attending MD: Maylon Peppers , , [id number] CSN: [id number] Age: 66 Admit Type: Outpatient Procedure:                Colonoscopy Indications:              Screening for colorectal malignant neoplasm Providers:                Maylon Peppers, Crystal Page, Raphael Gibney,                            Technician Referring MD:              Medicines:                Monitored Anesthesia Care Complications:            No immediate complications. Estimated Blood Loss:     Estimated blood loss: none. Procedure:                Pre-Anesthesia Assessment:                           - Prior to the procedure, a History and Physical                            was performed, and patient medications, allergies                            and sensitivities were reviewed. The patient's                            tolerance of previous anesthesia was reviewed.                           - The risks and benefits of the procedure and the                            sedation options and risks were discussed with the                            patient. All questions were answered and informed                            consent was obtained.                           - ASA Grade Assessment: I - A normal, healthy                            patient.                           After obtaining informed consent, the colonoscope                            was passed under direct vision. Throughout the  procedure, the patient's blood pressure, pulse, and                            oxygen saturations were monitored continuously. The                            [id number] ([id number]) was introduced through the                            anus and advanced to the the cecum, identified by                            appendiceal orifice and ileocecal valve. The                             colonoscopy was performed without difficulty. The                            patient tolerated the procedure well. The quality                            of the bowel preparation was excellent. Scope In: 7:32:58 AM Scope Out: 7:50:47 AM Scope Withdrawal Time: 0 hours 13 minutes 37 seconds  Total Procedure Duration: 0 hours 17 minutes 49 seconds  Findings:      The perianal and digital rectal examinations were normal.      A 2 mm polyp was found in the ascending colon. The polyp was sessile.       The polyp was removed with a cold snare. Resection and retrieval were       complete.      The retroflexed view of the distal rectum and anal verge was normal and       showed no anal or rectal abnormalities. Impression:               - One 2 mm polyp in the ascending colon, removed                            with a cold snare. Resected and retrieved.                           - The distal rectum and anal verge are normal on                            retroflexion view. Moderate Sedation:      Per Anesthesia Care Recommendation:           - Discharge patient to home (ambulatory).                           - Resume previous diet.                           - Await pathology results.                           - Repeat colonoscopy for  surveillance based on                            pathology results. Procedure Code(s):        --- Professional ---                           [phone], Colonoscopy, flexible; with removal of                            tumor(s), polyp(s), or other lesion(s) by snare                            technique Diagnosis Code(s):        --- Professional ---                           Z12.11, Encounter for screening for malignant                            neoplasm of colon                           D12.2, Benign neoplasm of ascending colon CPT copyright 2022 American [employer] Association. All rights reserved. The codes documented in this report are preliminary and upon  coder review may  be revised to meet current compliance requirements. Maylon Peppers, MD Maylon Peppers,  05/02/2022 7:54:05 AM This report has been signed electronically. Number of Addenda: 0

## 2022-05-06 LAB — SURGICAL PATHOLOGY

## 2022-05-07 ENCOUNTER — Encounter (INDEPENDENT_AMBULATORY_CARE_PROVIDER_SITE_OTHER): Payer: Self-pay | Admitting: *Deleted

## 2022-05-14 ENCOUNTER — Encounter (HOSPITAL_COMMUNITY): Payer: Self-pay | Admitting: Gastroenterology

## 2022-05-14 NOTE — Addendum Note (Signed)
Addendum  created 05/14/22 1200 by Myna Bright, CRNA   Intraprocedure Staff edited

## 2022-07-10 ENCOUNTER — Encounter: Payer: Self-pay | Admitting: Radiology

## 2022-08-21 DIAGNOSIS — M9905 Segmental and somatic dysfunction of pelvic region: Secondary | ICD-10-CM | POA: Diagnosis not present

## 2022-08-21 DIAGNOSIS — M9903 Segmental and somatic dysfunction of lumbar region: Secondary | ICD-10-CM | POA: Diagnosis not present

## 2022-08-21 DIAGNOSIS — M9902 Segmental and somatic dysfunction of thoracic region: Secondary | ICD-10-CM | POA: Diagnosis not present

## 2022-08-21 DIAGNOSIS — M62830 Muscle spasm of back: Secondary | ICD-10-CM | POA: Diagnosis not present

## 2022-08-22 DIAGNOSIS — M9905 Segmental and somatic dysfunction of pelvic region: Secondary | ICD-10-CM | POA: Diagnosis not present

## 2022-08-22 DIAGNOSIS — M62830 Muscle spasm of back: Secondary | ICD-10-CM | POA: Diagnosis not present

## 2022-08-22 DIAGNOSIS — M9903 Segmental and somatic dysfunction of lumbar region: Secondary | ICD-10-CM | POA: Diagnosis not present

## 2022-08-22 DIAGNOSIS — M9902 Segmental and somatic dysfunction of thoracic region: Secondary | ICD-10-CM | POA: Diagnosis not present

## 2022-08-25 DIAGNOSIS — M62830 Muscle spasm of back: Secondary | ICD-10-CM | POA: Diagnosis not present

## 2022-08-25 DIAGNOSIS — M9903 Segmental and somatic dysfunction of lumbar region: Secondary | ICD-10-CM | POA: Diagnosis not present

## 2022-08-25 DIAGNOSIS — M9905 Segmental and somatic dysfunction of pelvic region: Secondary | ICD-10-CM | POA: Diagnosis not present

## 2022-08-25 DIAGNOSIS — M9902 Segmental and somatic dysfunction of thoracic region: Secondary | ICD-10-CM | POA: Diagnosis not present

## 2022-08-26 ENCOUNTER — Ambulatory Visit (HOSPITAL_COMMUNITY)
Admission: RE | Admit: 2022-08-26 | Discharge: 2022-08-26 | Disposition: A | Discharge status: Home / Self Care | Payer: No Typology Code available for payment source | Source: Ambulatory Visit | Attending: Family Medicine | Admitting: Family Medicine

## 2022-08-26 ENCOUNTER — Ambulatory Visit (INDEPENDENT_AMBULATORY_CARE_PROVIDER_SITE_OTHER): Payer: No Typology Code available for payment source | Admitting: Family Medicine

## 2022-08-26 ENCOUNTER — Other Ambulatory Visit: Payer: Self-pay

## 2022-08-26 ENCOUNTER — Emergency Department (HOSPITAL_COMMUNITY)
Admission: EM | Admit: 2022-08-26 | Discharge: 2022-08-26 | Disposition: A | Discharge status: Home / Self Care | Payer: No Typology Code available for payment source | Attending: Emergency Medicine | Admitting: Emergency Medicine

## 2022-08-26 VITALS — BP 130/82 | HR 60 | Temp 97.5°F | Ht 70.0 in | Wt 229.0 lb

## 2022-08-26 DIAGNOSIS — S2231XA Fracture of one rib, right side, initial encounter for closed fracture: Secondary | ICD-10-CM | POA: Diagnosis not present

## 2022-08-26 DIAGNOSIS — R0609 Other forms of dyspnea: Secondary | ICD-10-CM | POA: Insufficient documentation

## 2022-08-26 DIAGNOSIS — Z13220 Encounter for screening for lipoid disorders: Secondary | ICD-10-CM

## 2022-08-26 DIAGNOSIS — M5441 Lumbago with sciatica, right side: Secondary | ICD-10-CM | POA: Diagnosis not present

## 2022-08-26 DIAGNOSIS — R0600 Dyspnea, unspecified: Secondary | ICD-10-CM | POA: Diagnosis not present

## 2022-08-26 DIAGNOSIS — M545 Low back pain, unspecified: Secondary | ICD-10-CM | POA: Diagnosis present

## 2022-08-26 DIAGNOSIS — M5431 Sciatica, right side: Secondary | ICD-10-CM

## 2022-08-26 MED ORDER — METHOCARBAMOL 500 MG PO TABS: 500.0000 mg | ORAL_TABLET | Freq: Three times a day (TID) | ORAL | 0 refills | Status: DC

## 2022-08-26 MED ORDER — METHYLPREDNISOLONE 4 MG PO TBPK: ORAL_TABLET | ORAL | 0 refills | Status: DC

## 2022-08-26 MED ORDER — OXYCODONE-ACETAMINOPHEN 5-325 MG PO TABS: 1.0000 | ORAL_TABLET | ORAL | 0 refills | Status: DC | PRN

## 2022-08-26 MED ORDER — LEVOCETIRIZINE DIHYDROCHLORIDE 5 MG PO TABS: 5.0000 mg | ORAL_TABLET | Freq: Every evening | ORAL | 3 refills | Status: DC

## 2022-08-26 MED ORDER — OXYCODONE-ACETAMINOPHEN 5-325 MG PO TABS
1.0000 | ORAL_TABLET | Freq: Once | ORAL | Status: AC
  Administered 2022-08-26: 1 via ORAL
  Filled 2022-08-26: qty 1

## 2022-08-26 NOTE — Patient Instructions (Signed)
EKG normal.  Get a chest x-ray at the hospital.  Labs today.  Referral to cardiology placed.  Dr. Adriana Simas

## 2022-08-26 NOTE — ED Triage Notes (Signed)
Pt states threw back out last week and has been seeing a [facility]. Pt has history of chronic back pain from injury 20 years ago. Today pt states pain radiates to right thigh and thigh feels like it is burning. Pt was at PCP today for different complaint and had blood work but did not mention this complaint at time of visit.

## 2022-08-26 NOTE — Progress Notes (Signed)
Subjective:  Patient ID: Brent Davis, male    DOB: 11-12-1955  Age: 67 y.o. MRN: [id number]  CC: Chief Complaint  Patient presents with   Shortness of Breath    Has noticed it for about a year and worse now have been using allergy meds   Nasal Congestion    HPI:  67 year old male presents for evaluation of shortness of breath with exertion.  1 year history of dyspnea on exertion.  Occurs with mowing the law, going up an incline, or taking out his garbage pills to the end of the driveway.  No diaphoresis.  No chest pain.  He does state that he has had some issues with congestion and rhinorrhea.  He takes allergy medication for this.  He reports that he has gained some weight.  He is now retired.  No other reported symptoms.  No other complaints or concerns at this time.  Patient Active Problem List   Diagnosis Date Noted   DOE (dyspnea on exertion) 08/26/2022   Osteoarthritis of right knee 12/02/2021   Hearing deficit, right 12/02/2021    Social Hx   Social History   Socioeconomic History   Marital status: Married    Spouse name: Not on file   Number of children: Not on file   Years of education: Not on file   Highest education level: 12th grade  Occupational History   Not on file  Tobacco Use   Smoking status: Never   Smokeless tobacco: Never  Vaping Use   Vaping Use: Never used  Substance and Sexual Activity   Alcohol use: Yes    Comment: 2-3/week   Drug use: Never   Sexual activity: Not on file  Other Topics Concern   Not on file  Social History Narrative   Not on file   Social Determinants of Health   Financial Resource Strain: Low Risk  (08/26/2022)   Overall Financial Resource Strain (CARDIA)    Difficulty of Paying Living Expenses: Not hard at all  Food Insecurity: No Food Insecurity (08/26/2022)   Hunger Vital Sign    Worried About Running Out of Food in the Last Year: Never true    Ran Out of Food in the Last Year: Never true  Transportation  Needs: No Transportation Needs (08/26/2022)   PRAPARE - Administrator, Civil Service (Medical): No    Lack of Transportation (Non-Medical): No  Physical Activity: Sufficiently Active (08/26/2022)   Exercise Vital Sign    Days of Exercise per Week: 5 days    Minutes of Exercise per Session: 60 min  Stress: No Stress Concern Present (08/26/2022)   [employer] of Occupational Health - Occupational Stress Questionnaire    Feeling of Stress : Not at all  Social Connections: Unknown (08/26/2022)   Social Connection and Isolation Panel [NHANES]    Frequency of Communication with Friends and Family: Once a week    Frequency of Social Gatherings with Friends and Family: Patient declined    Attends Religious Services: Patient declined    Database administrator or Organizations: Yes    Attends Engineer, structural: Patient declined    Marital Status: Married    Review of Systems Per HPI  Objective:  BP 130/82   Pulse 60   Temp (!) 97.5 F (36.4 C)   Ht  (1.778 m)   Wt 229 lb (103.9 kg)   SpO2 95%   BMI 32.86 kg/m      08/26/2022  8:19 AM 05/02/2022    7:58 AM 05/02/2022    7:55 AM  BP/Weight  Systolic BP 130 98 93  Diastolic BP 82 67 45  Wt. (Lbs) 229    BMI 32.86 kg/m2      Physical Exam Vitals and nursing note reviewed.  Constitutional:      General: He is not in acute distress.    Appearance: Normal appearance.  HENT:     Head: Normocephalic and atraumatic.  Eyes:     General:        Right eye: No discharge.        Left eye: No discharge.     Conjunctiva/sclera: Conjunctivae normal.  Cardiovascular:     Rate and Rhythm: Normal rate and regular rhythm.     Comments: Trace lower extremity edema. Pulmonary:     Effort: Pulmonary effort is normal.     Breath sounds: Normal breath sounds. No wheezing, rhonchi or rales.  Neurological:     Mental Status: He is alert.  Psychiatric:        Mood and Affect: Mood normal.        Behavior:  Behavior normal.     Lab Results  Component Value Date   WBC 7.7 12/02/2021   HGB 15.5 12/02/2021   HCT 46.0 12/02/2021   PLT 217 12/02/2021   GLUCOSE 86 12/02/2021   CHOL 157 12/02/2021   TRIG 74 12/02/2021   HDL 62 12/02/2021   LDLCALC 81 12/02/2021   ALT 20 12/02/2021   AST 23 12/02/2021   NA 139 12/02/2021   K 5.1 12/02/2021   CL 101 12/02/2021   CREATININE 1.01 12/02/2021   BUN 15 12/02/2021   CO2 26 12/02/2021   EKG: Independent interpretation-sinus bradycardia with a rate of 58.  Normal axis.  Normal intervals.  No ST or T wave changes.  Normal EKG.  Assessment & Plan:   Problem List Items Addressed This Visit       Other   DOE (dyspnea on exertion) - Primary    Reassuring exam.  EKG normal. Further workup today with chest x-ray and labs. Referring to cardiology.  Will need echo and either stress test or CT coronary.      Relevant Orders   DG Chest 2 View   EKG 12-Lead (Completed)   CBC   CMP14+EGFR   Brain natriuretic peptide   Ambulatory referral to Cardiology   Other Visit Diagnoses     Screening, lipid       Relevant Orders   Lipid panel       Meds ordered this encounter  Medications   levocetirizine (XYZAL) 5 MG tablet    Sig: Take 1 tablet (5 mg total) by mouth every evening.    Dispense:  90 tablet    Refill:  3    Benjamim Harnish DO [facility]

## 2022-08-26 NOTE — Discharge Instructions (Signed)
Continue to alternate ice and heat to your lower back area.  You may also use 4% lidocaine patches that you buy over-the-counter without a prescription.  Use those as directed.  Take the medication prescribed today as directed.  Please follow-up with your primary care provider for recheck.  Also given follow-up information for [facility].  Return emergency department for any new or worsening symptoms.

## 2022-08-26 NOTE — Assessment & Plan Note (Signed)
Reassuring exam.  EKG normal. Further workup today with chest x-ray and labs. Referring to cardiology.  Will need echo and either stress test or CT coronary.

## 2022-08-26 NOTE — Addendum Note (Signed)
Addended by: [name] on: 08/26/2022 09:01 AM   Modules accepted: Level of Service

## 2022-08-27 DIAGNOSIS — M62830 Muscle spasm of back: Secondary | ICD-10-CM | POA: Diagnosis not present

## 2022-08-27 DIAGNOSIS — M9902 Segmental and somatic dysfunction of thoracic region: Secondary | ICD-10-CM | POA: Diagnosis not present

## 2022-08-27 DIAGNOSIS — M9903 Segmental and somatic dysfunction of lumbar region: Secondary | ICD-10-CM | POA: Diagnosis not present

## 2022-08-27 DIAGNOSIS — M9905 Segmental and somatic dysfunction of pelvic region: Secondary | ICD-10-CM | POA: Diagnosis not present

## 2022-08-27 LAB — CBC
Hematocrit: 44.7 % (ref 37.5–51.0)
Hemoglobin: 15 g/dL (ref 13.0–17.7)
MCH: 31 pg (ref 26.6–33.0)
MCHC: 33.6 g/dL (ref 31.5–35.7)
MCV: 92 fL (ref 79–97)
Platelets: 219 10*3/uL (ref 150–450)
RBC: 4.84 x10E6/uL (ref 4.14–5.80)
RDW: 12.6 % (ref 11.6–15.4)
WBC: 4.2 10*3/uL (ref 3.4–10.8)

## 2022-08-27 LAB — LIPID PANEL
Chol/HDL Ratio: 3 ratio (ref 0.0–5.0)
Cholesterol, Total: 150 mg/dL (ref 100–199)
HDL: 50 mg/dL (ref >39)
LDL Chol Calc (NIH): 88 mg/dL (ref 0–99)
Triglycerides: 57 mg/dL (ref 0–149)
VLDL Cholesterol Cal: 12 mg/dL (ref 5–40)

## 2022-08-27 LAB — CMP14+EGFR
ALT: 16 IU/L (ref 0–44)
AST: 21 IU/L (ref 0–40)
Albumin/Globulin Ratio: 1.9 (ref 1.2–2.2)
Albumin: 4.1 g/dL (ref 3.9–4.9)
Alkaline Phosphatase: 94 IU/L (ref 44–121)
BUN/Creatinine Ratio: 14 (ref 10–24)
BUN: 15 mg/dL (ref 8–27)
Bilirubin Total: 0.5 mg/dL (ref 0.0–1.2)
CO2: 24 mmol/L (ref 20–29)
Calcium: 8.7 mg/dL (ref 8.6–10.2)
Chloride: 103 mmol/L (ref 96–106)
Creatinine, Ser: 1.08 mg/dL (ref 0.76–1.27)
Globulin, Total: 2.2 g/dL (ref 1.5–4.5)
Glucose: 103 mg/dL — ABNORMAL HIGH (ref 70–99)
Potassium: 5 mmol/L (ref 3.5–5.2)
Sodium: 142 mmol/L (ref 134–144)
Total Protein: 6.3 g/dL (ref 6.0–8.5)
eGFR: 75 mL/min/{1.73_m2} (ref >59)

## 2022-08-27 LAB — BRAIN NATRIURETIC PEPTIDE: BNP: 36 pg/mL (ref 0.0–100.0)

## 2022-08-28 NOTE — ED Provider Notes (Signed)
[facility] EMERGENCY DEPARTMENT AT [facility] Provider Note   CSN: [id number] Arrival date & time: 08/26/22  1118     History  Chief Complaint  Patient presents with   Leg Pain   Back Pain    Brent Davis is a 67 y.o. male.   Leg Pain Associated symptoms: back pain   Associated symptoms: no fever   Back Pain Associated symptoms: leg pain and numbness (Pins and needle sensation right thigh)   Associated symptoms: no abdominal pain, no chest pain, no dysuria, no fever and no weakness        Brent Davis is a 67 y.o. male with past medical history of prior back surgery and osteoarthritis of the right knee who presents to the Emergency Department complaining of right-sided low back pain.  He states this is a reoccurring problem for him.  He states that occasionally an area of his right lower back will "pop out."  States this stems from a back injury 20 years ago.  Recently, he states he threw his back out again and began seeing a chiropractor nearly 1 week ago.  He was at his PCPs office earlier today for different complaint, when he returned home he noticed pain, pins and needle sensation and burning sensation to his right thigh area.  This does not radiate into his right lower leg or foot.  He states his chiropractor adjustments so far do not seem to be helping.  He denies any fever, chills, urine or bowel changes, weakness of his lower extremities, or abdominal or groin pain  Home Medications Prior to Admission medications   Medication Sig Start Date End Date Taking? Authorizing Provider  methocarbamol (ROBAXIN) 500 MG tablet Take 1 tablet (500 mg total) by mouth 3 (three) times daily. 08/26/22  Yes Chaquita Basques, PA-C  methylPREDNISolone (MEDROL DOSEPAK) 4 MG TBPK tablet Take 6 tablets day one, 5 tablets day two, 4 tablets day three, 3 tablets day four, 2 tablets day five, then 1 tablet day six 08/26/22  Yes Braxtin Bamba, PA-C  oxyCODONE-acetaminophen  (PERCOCET/ROXICET) 5-325 MG tablet Take 1 tablet by mouth every 4 (four) hours as needed. 08/26/22  Yes Spenser Harren, PA-C  diphenhydrAMINE (BENADRYL) 25 mg capsule Take 25 mg by mouth daily as needed (runny nose/allergies.).    [provider]  fluticasone (FLONASE) 50 MCG/ACT nasal spray Place 1 spray into both nostrils 2 (two) times daily. Patient taking differently: Place 1 spray into both nostrils 2 (two) times daily as needed for allergies. 08/16/21   Particia Nearing, PA-C  levocetirizine (XYZAL) 5 MG tablet Take 1 tablet (5 mg total) by mouth every evening. 08/26/22   [name], DO      Allergies    Penicillins    Review of Systems   Review of Systems  Constitutional:  Negative for appetite change, chills and fever.  Respiratory:  Negative for shortness of breath.   Cardiovascular:  Negative for chest pain and leg swelling.  Gastrointestinal:  Negative for abdominal pain, diarrhea, nausea and vomiting.  Genitourinary:  Negative for dysuria and flank pain.  Musculoskeletal:  Positive for back pain.  Skin:  Negative for rash and wound.  Neurological:  Positive for numbness (Pins and needle sensation right thigh). Negative for dizziness and weakness.    Physical Exam Updated Vital Signs BP (!) 149/86 (BP Location: Right Arm)   Pulse (!) 58   Temp 97.8 F (36.6 C)   Resp 18   Ht  (  1.778 m)   Wt 103 kg   SpO2 97%   BMI 32.58 kg/m  Physical Exam Vitals and nursing note reviewed.  Constitutional:      General: He is not in acute distress.    Appearance: Normal appearance.  Cardiovascular:     Rate and Rhythm: Normal rate and regular rhythm.     Pulses: Normal pulses.  Pulmonary:     Effort: Pulmonary effort is normal.  Abdominal:     Palpations: Abdomen is soft.     Tenderness: There is no abdominal tenderness.  Musculoskeletal:        General: Tenderness present.     Right lower leg: No edema.     Left lower leg: No edema.     Comments:  Patient has tenderness to palpation of right lumbar paraspinal muscles and right SI joint space.  Negative straight leg raise bilaterally.  No midline tenderness.  Neurological:     Mental Status: He is alert.     ED Results / Procedures / Treatments   Labs (all labs ordered are listed, but only abnormal results are displayed) Labs Reviewed - No data to display  EKG None  Radiology No results found.  Procedures Procedures    Medications Ordered in ED Medications  oxyCODONE-acetaminophen (PERCOCET/ROXICET) 5-325 MG per tablet 1 tablet (1 tablet Oral Given 08/26/22 1358)    ED Course/ Medical Decision Making/ A&P                             Medical Decision Making Patient here with complaint of right-sided low back pain that is recurrent.  He has been receiving adjustments from his chiropractor.  Earlier, he began having burning sensation with paresthesias of the right thigh.  No symptoms radiating to lower leg, groin, or foot.    Differential would include but not limited to cauda equina, lumbar radiculopathy, musculoskeletal injury, sciatica  Amount and/or Complexity of Data Reviewed Discussion of management or test interpretation with external provider(s): Patient has ambulated in the department without ataxia.  No findings or red flags on exam to suggest cauda equina.  No reported trauma or fall.   Doubt emergent neurologic process no indication for emergent imaging at this time.  Patient has not tried any medical management of his symptoms.  I feel would be worthwhile to treat with steroid taper.  I will also provide short course of pain medication, database was reviewed.  I recommended close outpatient follow-up with PCP or with neurosurgery if symptoms or not improving.  Risk Prescription drug management.           Final Clinical Impression(s) / ED Diagnoses Final diagnoses:  Sciatica of right side    Rx / DC Orders ED Discharge Orders          Ordered     oxyCODONE-acetaminophen (PERCOCET/ROXICET) 5-325 MG tablet  Every 4 hours PRN        08/26/22 1348    methylPREDNISolone (MEDROL DOSEPAK) 4 MG TBPK tablet        08/26/22 1348    methocarbamol (ROBAXIN) 500 MG tablet  3 times daily        08/26/22 1348              Pauline Aus, PA-C 08/28/22 1536    Gerhard Munch, MD 09/01/22 1047

## 2022-09-08 DIAGNOSIS — M5416 Radiculopathy, lumbar region: Secondary | ICD-10-CM | POA: Diagnosis not present

## 2022-09-08 DIAGNOSIS — Z6832 Body mass index (BMI) 32.0-32.9, adult: Secondary | ICD-10-CM | POA: Diagnosis not present

## 2022-10-08 DIAGNOSIS — M9905 Segmental and somatic dysfunction of pelvic region: Secondary | ICD-10-CM | POA: Diagnosis not present

## 2022-10-08 DIAGNOSIS — M62830 Muscle spasm of back: Secondary | ICD-10-CM | POA: Diagnosis not present

## 2022-10-08 DIAGNOSIS — M9903 Segmental and somatic dysfunction of lumbar region: Secondary | ICD-10-CM | POA: Diagnosis not present

## 2022-10-08 DIAGNOSIS — M9902 Segmental and somatic dysfunction of thoracic region: Secondary | ICD-10-CM | POA: Diagnosis not present

## 2022-10-10 DIAGNOSIS — M62830 Muscle spasm of back: Secondary | ICD-10-CM | POA: Diagnosis not present

## 2022-10-10 DIAGNOSIS — M9902 Segmental and somatic dysfunction of thoracic region: Secondary | ICD-10-CM | POA: Diagnosis not present

## 2022-10-10 DIAGNOSIS — M9905 Segmental and somatic dysfunction of pelvic region: Secondary | ICD-10-CM | POA: Diagnosis not present

## 2022-10-10 DIAGNOSIS — M9903 Segmental and somatic dysfunction of lumbar region: Secondary | ICD-10-CM | POA: Diagnosis not present

## 2022-10-13 DIAGNOSIS — M5416 Radiculopathy, lumbar region: Secondary | ICD-10-CM | POA: Diagnosis not present

## 2022-11-11 ENCOUNTER — Encounter: Payer: Self-pay | Admitting: Internal Medicine

## 2022-11-11 ENCOUNTER — Ambulatory Visit: Payer: No Typology Code available for payment source | Attending: Internal Medicine | Admitting: Internal Medicine

## 2022-11-11 VITALS — BP 116/72 | HR 72 | Ht 70.0 in | Wt 229.0 lb

## 2022-11-11 DIAGNOSIS — R0602 Shortness of breath: Secondary | ICD-10-CM | POA: Diagnosis not present

## 2022-11-11 NOTE — Progress Notes (Signed)
Cardiology Office Note   Date:  11/11/2022   ID:  Brent Davis, DOB 1956/03/05, MRN [id number]  PCP:  [name], DO  Cardiologist:   [name], MD   Patient referred for DOE    History of Present Illness: Brent Davis is a 67 y.o. 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 Tr edema No pakpitations         Current Meds  Medication Sig   diphenhydrAMINE (BENADRYL) 25 mg capsule Take 25 mg by mouth daily as needed (runny nose/allergies.).   fluticasone (FLONASE) 50 MCG/ACT nasal spray Place 1 spray into both nostrils 2 (two) times daily. (Patient taking differently: Place 1 spray into both nostrils 2 (two) times daily as needed for allergies.)   tadalafil (CIALIS) 5 MG tablet      Allergies:   Penicillins   Past Medical History:  Diagnosis Date   Osteoarthritis of right knee     Past Surgical History:  Procedure Laterality Date   BACK SURGERY     COLONOSCOPY WITH PROPOFOL N/A 05/02/2022   Procedure: COLONOSCOPY WITH PROPOFOL;  Surgeon: Dolores Frame, MD;  Location: [facility];  Service: Gastroenterology;  Laterality: N/A;  7:30am, asa 1-2   POLYPECTOMY  05/02/2022   Procedure: POLYPECTOMY INTESTINAL;  Surgeon: Dolores Frame, MD;  Location: [facility];  Service: Gastroenterology;;     Social History:  The patient  reports that he quit smoking about 23 years ago. His smoking use included cigarettes. He has a 28.00 pack-year smoking history. He has never used smokeless tobacco. He reports current alcohol use. He reports that he does not use drugs.   Family History:  The patient's family history includes Asthma in his sister and sister;  Diabetes in his father; Epilepsy in his father.    ROS:  Please see the history of present illness. All other systems are reviewed and  Negative to the above problem except as noted.    PHYSICAL EXAM: VS:  BP 116/72   Pulse 72   Ht 5\' 10"  (1.778 m)   Wt 229 lb (103.9 kg)   SpO2 98%   BMI 32.86 kg/m   GEN: Well nourished, well developed, in no acute distress  HEENT: normal  Neck: no JVD, carotid bruits, or masses Cardiac: RRR; no murmurs,  No LE  edema  Respiratory:  clear to auscultation bilaterally, Mild wheeze GI: soft, nontender,No hepatomegaly  MS: no deformity Moving all extremities   Skin: warm and dry, no rash Neuro:  Strength and sensation are intact Psych: euthymic mood, full affect   EKG:  EKG is not  ordered today.   Lipid Panel    Component Value Date/Time   CHOL 150 08/26/2022 0913   TRIG 57 08/26/2022 0913   HDL 50 08/26/2022 0913   CHOLHDL 3.0 08/26/2022 0913   LDLCALC 88 08/26/2022 0913      Wt Readings from Last 3 Encounters:  11/11/22 229 lb (103.9 kg)  08/26/22 227 lb 1.2 oz (103 kg)  08/26/22 229 lb (103.9 kg)      ASSESSMENT AND PLAN:  1  Dyspnea on exertion.   Pt with dyspnea He has mild wheeze on exam Would recomm 1  PFTs   2 Noncontrast CT of chest to evaluate lungs and also to look for coronary calcium   Results will determine further testing     Current medicines are reviewed at length with the patient today.  The patient does not have concerns regarding medicines.  Signed, [name], MD  11/11/2022 10:20 AM    [facility] [address] [place], [place], [place]  [address] Phone: [phone]; Fax: [phone]

## 2022-11-11 NOTE — Patient Instructions (Signed)
Medication Instructions:  Your physician recommends that you continue on your current medications as directed. Please refer to the Current Medication list given to you today.   Labwork: None today  Testing/Procedures: Non-Contrast Chest CT  Your physician has recommended that you have a pulmonary function test. Pulmonary Function Tests are a group of tests that measure how well air moves in and out of your lungs.   Follow-Up: To be determined after testing  Any Other Special Instructions Will Be Listed Below (If Applicable).  If you need a refill on your cardiac medications before your next appointment, please call your pharmacy.

## 2022-12-09 ENCOUNTER — Ambulatory Visit (HOSPITAL_COMMUNITY)
Admission: RE | Admit: 2022-12-09 | Discharge: 2022-12-09 | Disposition: A | Discharge status: Home / Self Care | Payer: No Typology Code available for payment source | Source: Ambulatory Visit | Attending: Internal Medicine | Admitting: Internal Medicine

## 2022-12-09 DIAGNOSIS — R590 Localized enlarged lymph nodes: Secondary | ICD-10-CM | POA: Diagnosis not present

## 2022-12-09 DIAGNOSIS — R0602 Shortness of breath: Secondary | ICD-10-CM | POA: Diagnosis not present

## 2022-12-09 DIAGNOSIS — R0600 Dyspnea, unspecified: Secondary | ICD-10-CM | POA: Diagnosis not present

## 2022-12-09 DIAGNOSIS — I700 Atherosclerosis of aorta: Secondary | ICD-10-CM | POA: Diagnosis not present

## 2022-12-18 ENCOUNTER — Telehealth: Payer: Self-pay | Admitting: *Deleted

## 2022-12-18 DIAGNOSIS — Z13220 Encounter for screening for lipoid disorders: Secondary | ICD-10-CM

## 2022-12-18 DIAGNOSIS — R0609 Other forms of dyspnea: Secondary | ICD-10-CM

## 2022-12-18 MED ORDER — ROSUVASTATIN CALCIUM 10 MG PO TABS: 10.0000 mg | ORAL_TABLET | Freq: Every day | ORAL | 11 refills | Status: DC

## 2022-12-18 NOTE — Telephone Encounter (Signed)
Pt notified and orders placed  

## 2022-12-18 NOTE — Telephone Encounter (Signed)
-----   Message from Nurse Dewayne Hatch B sent at 12/18/2022  9:01 AM EDT -----  ----- Message ----- From: Pricilla Riffle, MD Sent: 12/17/2022   6:37 PM EDT To: Bertram Millard, RN  Reviewed with pt  WIll review with pulmonary lung findings Patient should  be set up for PET /CT stress test Also should be on statin  Goal LDL below 70   Start Crestor 10   Follow up lipids in 8 wks with liver panel

## 2023-04-09 DIAGNOSIS — F0390 Unspecified dementia without behavioral disturbance: Secondary | ICD-10-CM | POA: Diagnosis not present

## 2023-04-09 DIAGNOSIS — I2510 Atherosclerotic heart disease of native coronary artery without angina pectoris: Secondary | ICD-10-CM | POA: Diagnosis not present

## 2023-04-09 DIAGNOSIS — I4891 Unspecified atrial fibrillation: Secondary | ICD-10-CM | POA: Diagnosis not present

## 2023-04-09 DIAGNOSIS — H814 Vertigo of central origin: Secondary | ICD-10-CM | POA: Diagnosis not present

## 2023-04-09 DIAGNOSIS — R739 Hyperglycemia, unspecified: Secondary | ICD-10-CM | POA: Diagnosis not present

## 2023-04-09 DIAGNOSIS — R079 Chest pain, unspecified: Secondary | ICD-10-CM | POA: Diagnosis not present

## 2023-04-09 DIAGNOSIS — R0602 Shortness of breath: Secondary | ICD-10-CM | POA: Diagnosis not present

## 2023-04-09 DIAGNOSIS — R55 Syncope and collapse: Secondary | ICD-10-CM | POA: Diagnosis not present

## 2023-04-09 DIAGNOSIS — I480 Paroxysmal atrial fibrillation: Secondary | ICD-10-CM | POA: Diagnosis not present

## 2023-04-09 DIAGNOSIS — Z66 Do not resuscitate: Secondary | ICD-10-CM | POA: Diagnosis not present

## 2023-04-09 DIAGNOSIS — Z87891 Personal history of nicotine dependence: Secondary | ICD-10-CM | POA: Diagnosis not present

## 2023-04-09 DIAGNOSIS — I609 Nontraumatic subarachnoid hemorrhage, unspecified: Secondary | ICD-10-CM | POA: Diagnosis not present

## 2023-04-09 DIAGNOSIS — R2681 Unsteadiness on feet: Secondary | ICD-10-CM | POA: Diagnosis not present

## 2023-04-09 DIAGNOSIS — Z8673 Personal history of transient ischemic attack (TIA), and cerebral infarction without residual deficits: Secondary | ICD-10-CM | POA: Diagnosis not present

## 2023-04-09 DIAGNOSIS — G459 Transient cerebral ischemic attack, unspecified: Secondary | ICD-10-CM | POA: Diagnosis not present

## 2023-04-09 DIAGNOSIS — R42 Dizziness and giddiness: Secondary | ICD-10-CM | POA: Diagnosis not present

## 2023-04-10 DIAGNOSIS — G459 Transient cerebral ischemic attack, unspecified: Secondary | ICD-10-CM | POA: Diagnosis not present

## 2023-04-10 DIAGNOSIS — R42 Dizziness and giddiness: Secondary | ICD-10-CM | POA: Diagnosis not present

## 2023-04-10 DIAGNOSIS — R739 Hyperglycemia, unspecified: Secondary | ICD-10-CM | POA: Diagnosis not present

## 2023-04-10 DIAGNOSIS — H538 Other visual disturbances: Secondary | ICD-10-CM | POA: Diagnosis not present

## 2023-04-10 DIAGNOSIS — H814 Vertigo of central origin: Secondary | ICD-10-CM | POA: Diagnosis not present

## 2023-04-10 DIAGNOSIS — I480 Paroxysmal atrial fibrillation: Secondary | ICD-10-CM | POA: Diagnosis not present

## 2023-04-16 ENCOUNTER — Encounter: Payer: Self-pay | Admitting: Family Medicine

## 2023-04-16 ENCOUNTER — Ambulatory Visit: Payer: No Typology Code available for payment source | Admitting: Family Medicine

## 2023-04-16 VITALS — BP 118/72 | HR 67 | Temp 97.7°F | Ht 70.0 in | Wt 228.0 lb

## 2023-04-16 DIAGNOSIS — I2510 Atherosclerotic heart disease of native coronary artery without angina pectoris: Secondary | ICD-10-CM

## 2023-04-16 DIAGNOSIS — J479 Bronchiectasis, uncomplicated: Secondary | ICD-10-CM | POA: Diagnosis not present

## 2023-04-16 DIAGNOSIS — R42 Dizziness and giddiness: Secondary | ICD-10-CM | POA: Insufficient documentation

## 2023-04-16 MED ORDER — ROSUVASTATIN CALCIUM 10 MG PO TABS: 10.0000 mg | ORAL_TABLET | Freq: Every day | ORAL | 3 refills | Status: DC

## 2023-04-16 MED ORDER — MECLIZINE HCL 25 MG PO TABS: 25.0000 mg | ORAL_TABLET | Freq: Three times a day (TID) | ORAL | 0 refills | Status: DC | PRN

## 2023-04-16 NOTE — Patient Instructions (Signed)
Start the statin.  I will take care of the stress test and pulm referral.  Follow up in 3 months.

## 2023-04-16 NOTE — Progress Notes (Signed)
Subjective:  Patient ID: Brent Davis, male    DOB: [birth year]  Age: 67 y.o. MRN: [id number]  CC:   Chief Complaint  Patient presents with   hospital follow up for severe vertigo     Seen in [facility] this past weekend- checked for stroke , diagnosed with crystals in hi ear    Wheezing    Ongoing worsened     HPI:  67 year old male with recently diagnosed CAD based on CT scan as well as bronchiectasis and abnormal lung findings on CT presents for evaluation of the above.  Patient recently had a bout of dizziness that was most consistent with vertigo.  He was evaluated at an outside hospital.  Had CT imaging, MRI imaging, carotid Dopplers, etc. that were unremarkable.  He was advised to follow-up.  Of note, the discharge summary that I reviewed today was incorrect and had another patient's information on it.  I contacted this outside hospital and spoke with the provider who validated that his imaging and labs were unremarkable.  Patient states that he is feeling well.  He has had no additional bouts of dizziness.  Patient continues to have dyspnea on exertion.  I have reviewed his recent cardiology visit as well as imaging findings.  The chart reflects that he should have a stress test but this has not been done.  Will reach out to cardiology.  Additionally, patient's had no additional evaluation regarding abnormal CT lung findings.  Patient Active Problem List   Diagnosis Date Noted   CAD (coronary artery disease) 04/16/2023   Bronchiectasis (HCC) 04/16/2023   Vertigo 04/16/2023   DOE (dyspnea on exertion) 08/26/2022   Osteoarthritis of right knee 12/02/2021   Hearing deficit, right 12/02/2021    Social Hx   Social History   Socioeconomic History   Marital status: Married    Spouse name: Not on file   Number of children: Not on file   Years of education: Not on file   Highest education level: 12th grade  Occupational History   Not on file  Tobacco Use   Smoking  status: Former    Current packs/day: 0.00    Average packs/day: 1 pack/day for 28.0 years (28.0 ttl pk-yrs)    Types: Cigarettes    Start date: 98    Quit date: 2001    Years since quitting: 23.9   Smokeless tobacco: Never  Vaping Use   Vaping status: Never Used  Substance and Sexual Activity   Alcohol use: Yes    Comment: 2-3/week   Drug use: Never   Sexual activity: Yes    Birth control/protection: None  Other Topics Concern   Not on file  Social History Narrative   Not on file   Social Determinants of Health   Financial Resource Strain: Low Risk  (08/26/2022)   Overall Financial Resource Strain (CARDIA)    Difficulty of Paying Living Expenses: Not hard at all  Food Insecurity: No Food Insecurity (08/26/2022)   Hunger Vital Sign    Worried About Running Out of Food in the Last Year: Never true    Ran Out of Food in the Last Year: Never true  Transportation Needs: No Transportation Needs (08/26/2022)   PRAPARE - Administrator, Civil Service (Medical): No    Lack of Transportation (Non-Medical): No  Physical Activity: Sufficiently Active (08/26/2022)   Exercise Vital Sign    Days of Exercise per Week: 5 days    Minutes of Exercise per Session:  60 min  Stress: No Stress Concern Present (08/26/2022)   [employer] of Occupational Health - Occupational Stress Questionnaire    Feeling of Stress : Not at all  Social Connections: Unknown (08/26/2022)   Social Connection and Isolation Panel [NHANES]    Frequency of Communication with Friends and Family: Once a week    Frequency of Social Gatherings with Friends and Family: Patient declined    Attends Religious Services: Patient declined    Database administrator or Organizations: Yes    Attends Engineer, structural: Patient declined    Marital Status: Married    Review of Systems Per HPI  Objective:  BP 118/72   Pulse 67   Temp 97.7 F (36.5 C)   Ht 5\' 10"  (1.778 m)   Wt 228 lb (103.4 kg)    SpO2 96%   BMI 32.71 kg/m      04/16/2023   10:57 AM 11/11/2022   10:02 AM 08/26/2022   12:11 PM  BP/Weight  Systolic BP 118 116   Diastolic BP 72 72   Wt. (Lbs) 228 229 227.07  BMI 32.71 kg/m2 32.86 kg/m2 32.58 kg/m2    Physical Exam Constitutional:      General: He is not in acute distress.    Appearance: Normal appearance.  HENT:     Head: Normocephalic and atraumatic.  Cardiovascular:     Rate and Rhythm: Normal rate and regular rhythm.  Pulmonary:     Effort: Pulmonary effort is normal.     Breath sounds: Normal breath sounds. No wheezing, rhonchi or rales.  Neurological:     Mental Status: He is alert.  Psychiatric:        Mood and Affect: Mood normal.        Behavior: Behavior normal.     Lab Results  Component Value Date   WBC 4.2 08/26/2022   HGB 15.0 08/26/2022   HCT 44.7 08/26/2022   PLT 219 08/26/2022   GLUCOSE 103 (H) 08/26/2022   CHOL 150 08/26/2022   TRIG 57 08/26/2022   HDL 50 08/26/2022   LDLCALC 88 08/26/2022   ALT 16 08/26/2022   AST 21 08/26/2022   NA 142 08/26/2022   K 5.0 08/26/2022   CL 103 08/26/2022   CREATININE 1.08 08/26/2022   BUN 15 08/26/2022   CO2 24 08/26/2022     Assessment & Plan:   Problem List Items Addressed This Visit       Cardiovascular and Mediastinum   CAD (coronary artery disease) - Primary    Recent CT reviewed coronary disease in the LAD.  Was supposed to have a stress test.  This has not been done.  Unsure why.  I have reached out to cardiology.  I will call and try and schedule as well.  Recommended compliance with statin therapy.  He is in agreement with this.      Relevant Medications   rosuvastatin (CRESTOR) 10 MG tablet     Respiratory   Bronchiectasis (HCC)    Abnormal CT chest with findings of bronchiectasis.  Possible Mycobacterium infection.  Referring to pulmonology.      Relevant Orders   Ambulatory referral to Pulmonology     Other   Vertigo    Benign.  Meclizine as needed.        Meds ordered this encounter  Medications   meclizine (ANTIVERT) 25 MG tablet    Sig: Take 1 tablet (25 mg total) by mouth 3 (three) times daily as  needed for dizziness.    Dispense:  30 tablet    Refill:  0   rosuvastatin (CRESTOR) 10 MG tablet    Sig: Take 1 tablet (10 mg total) by mouth daily.    Dispense:  90 tablet    Refill:  3    Follow-up:  Return in about 3 months (around 07/15/2023).  Total time spent -30 minutes.  This includes time reviewing records, time spent directly with the patient discussing issues and concerns as well as speaking with outside hospital obtaining accurate records/clinical picture given incorrect discharge summary.  Everlene Other DO [facility]

## 2023-04-16 NOTE — Assessment & Plan Note (Addendum)
Recent CT reviewed coronary disease in the LAD.  Was supposed to have a stress test.  This has not been done.  Unsure why.  I have reached out to cardiology.  I will call and try and schedule as well.  Recommended compliance with statin therapy.  He is in agreement with this.

## 2023-04-16 NOTE — Assessment & Plan Note (Signed)
Benign.  Meclizine as needed.

## 2023-04-16 NOTE — Assessment & Plan Note (Signed)
Abnormal CT chest with findings of bronchiectasis.  Possible Mycobacterium infection.  Referring to pulmonology.

## 2023-04-27 ENCOUNTER — Other Ambulatory Visit (HOSPITAL_COMMUNITY): Payer: Self-pay | Admitting: *Deleted

## 2023-04-27 ENCOUNTER — Encounter (HOSPITAL_COMMUNITY): Payer: Self-pay

## 2023-04-27 DIAGNOSIS — R0600 Dyspnea, unspecified: Secondary | ICD-10-CM

## 2023-04-29 ENCOUNTER — Encounter (HOSPITAL_COMMUNITY)
Admission: RE | Admit: 2023-04-29 | Discharge: 2023-04-29 | Disposition: A | Discharge status: Home / Self Care | Payer: No Typology Code available for payment source | Source: Ambulatory Visit | Attending: Internal Medicine | Admitting: Internal Medicine

## 2023-04-29 DIAGNOSIS — R0609 Other forms of dyspnea: Secondary | ICD-10-CM | POA: Diagnosis not present

## 2023-04-29 LAB — NM PET CT CARDIAC PERFUSION MULTI W/ABSOLUTE BLOODFLOW
Base ST Depression (mm): 0 mm
LV dias vol: 98 mL (ref 62–150)
LV sys vol: 40 mL
MBFR: 2.2
Nuc Rest EF: 59 %
Nuc Stress EF: 66 %
Rest MBF: 0.85 ml/g/min
Rest Nuclear Isotope Dose: 26.8 mCi
ST Depression (mm): 0 mm
Stress MBF: 1.87 ml/g/min
Stress Nuclear Isotope Dose: 26.7 mCi

## 2023-04-29 MED ORDER — RUBIDIUM RB82 GENERATOR (RUBYFILL)
26.7700 | PACK | Freq: Once | INTRAVENOUS | Status: AC
  Administered 2023-04-29: 26.77 via INTRAVENOUS

## 2023-04-29 MED ORDER — REGADENOSON 0.4 MG/5ML IV SOLN
INTRAVENOUS | Status: AC
  Filled 2023-04-29: qty 5

## 2023-04-29 MED ORDER — RUBIDIUM RB82 GENERATOR (RUBYFILL)
26.6900 | PACK | Freq: Once | INTRAVENOUS | Status: AC
  Administered 2023-04-29: 26.69 via INTRAVENOUS

## 2023-04-29 MED ORDER — REGADENOSON 0.4 MG/5ML IV SOLN
0.4000 mg | Freq: Once | INTRAVENOUS | Status: AC
  Administered 2023-04-29: 0.4 mg via INTRAVENOUS

## 2023-05-05 ENCOUNTER — Telehealth: Payer: Self-pay

## 2023-05-05 DIAGNOSIS — Z13220 Encounter for screening for lipoid disorders: Secondary | ICD-10-CM

## 2023-05-05 NOTE — Telephone Encounter (Signed)
Patient notified and verbalized understanding. Patient had no questions or concerns at this time.  

## 2023-05-05 NOTE — Telephone Encounter (Signed)
-----   Message from Nurse Rudene Anda sent at 05/01/2023  3:57 PM EST -----  ----- Message ----- From: Pricilla Riffle, MD Sent: 04/30/2023   2:00 PM EST To: Bertram Millard, RN  Reviewed test results with patient   Small area of distal ischemia   NOrmal flow reserve  LVEF normal    He is feeling good  REcomm  Continue to stay active   On statin now   WIll need lipomed, Lpa and Apo B in later February 2025   Please call to schedule

## 2023-05-12 ENCOUNTER — Telehealth: Payer: Self-pay

## 2023-05-12 DIAGNOSIS — R911 Solitary pulmonary nodule: Secondary | ICD-10-CM

## 2023-05-12 NOTE — Telephone Encounter (Signed)
Attempted to reach patient to review, but no answer and no DPR on file. Left message asking that he call office back to review. Order placed for non-contrast chest CT at this time.

## 2023-05-12 NOTE — Telephone Encounter (Signed)
-----   Message from Rolla sent at 05/11/2023  7:28 AM EST ----- OVerread from radiology  They have followed a nodule near spine   Stable, may represent scarring but given proximity to spine recomm follow CT next August  (noncontrast of chest/lungs)

## 2023-05-18 NOTE — Telephone Encounter (Signed)
 Pt advised and verbalized understanding.... he will mark his calendar for Aug 2025 to be sure he gets his appt for the CT.

## 2023-06-22 NOTE — Progress Notes (Unsigned)
Brent Davis, male    DOB: 06-Jun-1955    MRN: [id number]   Brief patient profile:  4  yowm  from [place] recalls severe pna at least twice as teenager once needed admit at age 67  stopped smoking 2001 s obvious sequelae  referred to pulmonary clinic in [place]  06/24/2023 by Dr Adriana Simas  for bronchiectasis  dx on w/u for doe since 2021 .    Spring summer allergic rhinitis while in [place] starting in his 17s never complicated by  need for  inhalers   seems gradually worse    History of Present Illness  06/24/2023  Pulmonary/ 1st office eval/ Brent Davis / [facility]  Chief Complaint  Patient presents with   Consult    Bronchiectasis, shortness of breathe  Dyspnea: limited to 10 min pushing mower with variabledoe to mb and back = 100 ft slt incline back s stopping then sob ever since moving from [place] ( 6 y prior to OV )  where grade was flat but same distance so probably not "new onset" and has not really changed much since lived there  Cough: none including am  Sleep: sleeps flat one pillow  hears wheeze "forever" does not keep him up  SABA use: none  02: none     No obvious day to day or daytime pattern/variability or assoc excess/ purulent sputum or mucus plugs or hemoptysis or cp or chest tightness, subjective wheeze or overt sinus or hb symptoms.    Also denies any obvious fluctuation of symptoms with weather or environmental changes or other aggravating or alleviating factors except as outlined above   No unusual exposure hx or h/o childhood pna/ asthma or knowledge of premature birth.  Current Allergies, Complete Past Medical History, Past Surgical History, Family History, and Social History were reviewed in [employer] record.  ROS  The following are not active complaints unless bolded Hoarseness, sore throat, dysphagia, dental problems, itching, sneezing,  nasal congestion or discharge of excess mucus or purulent secretions, ear ache,   fever, chills,  sweats, unintended wt loss or wt gain, classically pleuritic or exertional cp,  orthopnea pnd or arm/hand swelling  or leg swelling, presyncope, palpitations, abdominal pain, anorexia, nausea, vomiting, diarrhea  or change in bowel habits or change in bladder habits, change in stools or change in urine, dysuria, hematuria,  rash, arthralgias, visual complaints, headache, numbness, weakness or ataxia or problems with walking or coordination,  change in mood or  memory.            Outpatient Medications Prior to Visit  Medication Sig Dispense Refill   levocetirizine (XYZAL) 5 MG tablet Take 5 mg by mouth daily at 6 (six) AM.     rosuvastatin (CRESTOR) 10 MG tablet Take 1 tablet (10 mg total) by mouth daily. 90 tablet 3   tadalafil (CIALIS) 5 MG tablet Take 5 mg by mouth daily as needed for erectile dysfunction.     cetirizine (ZYRTEC) 10 MG chewable tablet Chew 10 mg by mouth daily.     fluticasone (FLONASE) 50 MCG/ACT nasal spray Place into both nostrils daily.     meclizine (ANTIVERT) 25 MG tablet Take 1 tablet (25 mg total) by mouth 3 (three) times daily as needed for dizziness. 30 tablet 0   No facility-administered medications prior to visit.    Past Medical History:  Diagnosis Date   Osteoarthritis of right knee       Objective:     BP 128/72  Pulse (!) 57   Ht 5\' 10"  (1.778 m)   Wt 229 lb (103.9 kg)   SpO2 96% Comment: room air  BMI 32.86 kg/m   SpO2: 96 % (room air)   Amb pleasant wm nad   HEENT : Oropharynx  clear top denture/ lower partial   Nasal turbinates nl    NECK :  without  apparent JVD/ palpable Nodes/TM    LUNGS: no acc muscle use,  Min barrel  contour chest wall with bilateral  slightly decreased bs s audible wheeze and  without cough on insp or exp maneuvers and min  Hyperresonant  to  percussion bilaterally    CV:  RRR  no s3 or murmur or increase in P2, and no edema   ABD:  soft and nontender with pos end  insp Hoover's  in the supine position.   No bruits or organomegaly appreciated   MS:  Nl gait/ ext warm without deformities Or obvious joint restrictions  calf tenderness, cyanosis or clubbing     SKIN: warm and dry without lesions    NEURO:  alert, approp, nl sensorium with  no motor or cerebellar deficits apparent.          I personally reviewed images and agree with radiology impression as follows:   Chest CT w/o contrast 12/09/22    1. Focal moderate varicoid bronchiectasis in the posteromedial right lower lobe with associated patchy tree-in-bud opacity and peribronchovascular nodularity up to 0.7 cm, probably infectious or inflammatory such as due to recurrent aspiration or atypical mycobacterial infection (MAI).   2. No evidence of interstitial lung disease. 3. One vessel coronary atherosclerosis. 4.  Aortic Atherosclerosis (ICD10-I70.0)  CT chest 04/29/23 on coronary cuts: No change       Assessment   DOE (dyspnea on exertion) Onset 2021 - 12//18/24 Cardiac PET scan nl function, small area of ischemia, rx medically  - 06/24/2023   Walked on RA  x  3  lap(s) =  approx 450  ft  @ fast  pace, stopped due to end of study  with lowest 02 sats 94% and mild sob  Suspect this is just a conditioning issue at this point   >>> PFTs to complete the w/u    Bronchiectasis [facility]) H/o 2  CAP's as teenager, admit x one  Chest CT w/o contrast 12/09/22    1. Focal moderate varicoid bronchiectasis in the posteromedial right lower lobe with associated patchy tree-in-bud opacity and peribronchovascular nodularity up to 0.7 cm, probably infectious or inflammatory such as due to recurrent aspiration or atypical mycobacterial infection (MAI).   CT chest 04/29/23 on coronary cuts: No change   -  Quant Gold TB 06/24/2023 >>> - Alpha one AT 06/24/2023 >> - PFTs 06/24/2023 >>>  No symptoms at present / w/u in progress  Advised on pathyophysisology and need to keep up with vaccinations per PCP  F/u  3 months   Allergic  rhinitis Advised on approp use of afrin/ flonase - Allergy screen 06/24/2023 >  Eos 0. /  IgE     I emphasized that nasal steroids have no immediate benefit in terms of improving symptoms.  To help them reached the target tissue, the patient should use Afrin two puffs every 12 hours applied one min before using the nasal steroids.  Afrin should be stopped after no more than 5 days.  If the symptoms worsen, Afrin can be restarted after 5 days off of therapy to prevent rebound congestion from overuse  of Afrin.  I also emphasized that in no way are nasal steroids a concern in terms of "addiction".    F/u 3 m         Each maintenance medication was reviewed in detail including emphasizing most importantly the difference between maintenance and prns and under what circumstances the prns are to be triggered using an action plan format where appropriate.  Total time for H and P, chart review, counseling, reviewing nasal  device(s) , directly observing portions of ambulatory 02 saturation study/ and generating customized AVS unique to this office visit / same day charting  > 60 min new pt eval                       Sandrea Hughs, MD 06/24/2023

## 2023-06-24 ENCOUNTER — Encounter: Payer: Self-pay | Admitting: Internal Medicine

## 2023-06-24 ENCOUNTER — Ambulatory Visit: Payer: No Typology Code available for payment source | Admitting: Internal Medicine

## 2023-06-24 VITALS — BP 128/72 | HR 57 | Ht 70.0 in | Wt 229.0 lb

## 2023-06-24 DIAGNOSIS — R0609 Other forms of dyspnea: Secondary | ICD-10-CM

## 2023-06-24 DIAGNOSIS — J309 Allergic rhinitis, unspecified: Secondary | ICD-10-CM

## 2023-06-24 DIAGNOSIS — J479 Bronchiectasis, uncomplicated: Secondary | ICD-10-CM

## 2023-06-24 DIAGNOSIS — M5416 Radiculopathy, lumbar region: Secondary | ICD-10-CM | POA: Insufficient documentation

## 2023-06-24 NOTE — Patient Instructions (Addendum)
Bronchiectasis =   you have scarring of your bronchial tubes which means that they don't function perfectly normally and mucus tends to pool in certain areas of your lung which can cause pneumonia and further scarring of your lung and bronchial tubes so I recommend you keep up with your pneumonia vaccination through your PCP, flu and RSV vaccinations as well     My office will be contacting you by phone for referral for  PFTs at [facility]  - if you don't hear back from my office within one week please call us back or notify us thru MyChart and we'll address it right away.    I emphasized that nasal steroids (like flonase) have no immediate benefit in terms of improving symptoms.  To help them reached the target tissue, the patient should use Afrin two puffs every 12 hours applied one min before using the nasal steroids.  Afrin should be stopped after no more than 5 days.  If the symptoms worsen, Afrin can be restarted after 5 days off of therapy to prevent rebound congestion from overuse of Afrin.  I also emphasized that in no way are nasal steroids a concern in terms of "addiction".     Please schedule a follow up visit in 3 months but call sooner if needed

## 2023-06-25 ENCOUNTER — Encounter: Payer: Self-pay | Admitting: Internal Medicine

## 2023-06-25 ENCOUNTER — Telehealth: Payer: Self-pay | Admitting: Internal Medicine

## 2023-06-25 DIAGNOSIS — R0609 Other forms of dyspnea: Secondary | ICD-10-CM

## 2023-06-25 DIAGNOSIS — J479 Bronchiectasis, uncomplicated: Secondary | ICD-10-CM

## 2023-06-25 DIAGNOSIS — J309 Allergic rhinitis, unspecified: Secondary | ICD-10-CM | POA: Insufficient documentation

## 2023-06-25 NOTE — Assessment & Plan Note (Addendum)
Onset 2021 - 12//18/24 Cardiac PET scan nl function, small area of ischemia, rx medically  - 06/24/2023   Walked on RA  x  3  lap(s) =  approx 450  ft  @ fast  pace, stopped due to end of study  with lowest 02 sats 94% and mild sob  Suspect this is just a conditioning issue at this point   >>> PFTs to complete the w/u

## 2023-06-25 NOTE — Assessment & Plan Note (Signed)
Advised on approp use of afrin/ flonase - Allergy screen 06/24/2023 >  Eos 0. /  IgE     I emphasized that nasal steroids have no immediate benefit in terms of improving symptoms.  To help them reached the target tissue, the patient should use Afrin two puffs every 12 hours applied one min before using the nasal steroids.  Afrin should be stopped after no more than 5 days.  If the symptoms worsen, Afrin can be restarted after 5 days off of therapy to prevent rebound congestion from overuse of Afrin.  I also emphasized that in no way are nasal steroids a concern in terms of "addiction".    F/u 3 m         Each maintenance medication was reviewed in detail including emphasizing most importantly the difference between maintenance and prns and under what circumstances the prns are to be triggered using an action plan format where appropriate.  Total time for H and P, chart review, counseling, reviewing nasal  device(s) , directly observing portions of ambulatory 02 saturation study/ and generating customized AVS unique to this office visit / same day charting  > 60 min new pt eval

## 2023-06-25 NOTE — Assessment & Plan Note (Addendum)
H/o 2  CAP's as teenager, admit x one  Chest CT w/o contrast 12/09/22    1. Focal moderate varicoid bronchiectasis in the posteromedial right lower lobe with associated patchy tree-in-bud opacity and peribronchovascular nodularity up to 0.7 cm, probably infectious or inflammatory such as due to recurrent aspiration or atypical mycobacterial infection (MAI).   CT chest 04/29/23 on coronary cuts: No change   -  Quant Gold TB 06/24/2023 >>> - Alpha one AT 06/24/2023 >> - PFTs 06/24/2023 >>>  No symptoms at present / w/u in progress  Advised on pathyophysisology and need to keep up with vaccinations per PCP  F/u  3 months

## 2023-06-25 NOTE — Telephone Encounter (Signed)
PFTs need to be ordered

## 2023-06-30 NOTE — Telephone Encounter (Signed)
Forwarding no longer helping this office

## 2023-06-30 NOTE — Telephone Encounter (Signed)
Please contact to schedule PFT. Thank you!

## 2023-07-06 LAB — CBC WITH DIFFERENTIAL/PLATELET
Basophils Absolute: 0 10*3/uL (ref 0.0–0.2)
Basos: 1 %
EOS (ABSOLUTE): 0.1 10*3/uL (ref 0.0–0.4)
Eos: 2 %
Hematocrit: 43.5 % (ref 37.5–51.0)
Hemoglobin: 14.6 g/dL (ref 13.0–17.7)
Immature Grans (Abs): 0 10*3/uL (ref 0.0–0.1)
Immature Granulocytes: 0 %
Lymphocytes Absolute: 1.5 10*3/uL (ref 0.7–3.1)
Lymphs: 29 %
MCH: 30.4 pg (ref 26.6–33.0)
MCHC: 33.6 g/dL (ref 31.5–35.7)
MCV: 90 fL (ref 79–97)
Monocytes Absolute: 0.4 10*3/uL (ref 0.1–0.9)
Monocytes: 8 %
Neutrophils Absolute: 3.1 10*3/uL (ref 1.4–7.0)
Neutrophils: 60 %
Platelets: 193 10*3/uL (ref 150–450)
RBC: 4.81 x10E6/uL (ref 4.14–5.80)
RDW: 13.1 % (ref 11.6–15.4)
WBC: 5.1 10*3/uL (ref 3.4–10.8)

## 2023-07-06 LAB — QUANTIFERON-TB GOLD PLUS
QuantiFERON Mitogen Value: >10 [IU]/mL
QuantiFERON Nil Value: 0.07 [IU]/mL
QuantiFERON TB1 Ag Value: 0.07 [IU]/mL
QuantiFERON TB2 Ag Value: 0.05 [IU]/mL
QuantiFERON-TB Gold Plus: NEGATIVE

## 2023-07-06 LAB — ALPHA-1-ANTITRYPSIN PHENOTYP: A-1 Antitrypsin: 156 mg/dL (ref 101–187)

## 2023-07-06 LAB — IGE: IgE (Immunoglobulin E), Serum: 11 [IU]/mL (ref 6–495)

## 2023-07-15 ENCOUNTER — Ambulatory Visit: Payer: No Typology Code available for payment source | Admitting: Family Medicine

## 2023-07-15 VITALS — BP 134/80 | HR 56 | Ht 70.0 in | Wt 227.0 lb

## 2023-07-15 DIAGNOSIS — E785 Hyperlipidemia, unspecified: Secondary | ICD-10-CM

## 2023-07-15 DIAGNOSIS — R0609 Other forms of dyspnea: Secondary | ICD-10-CM

## 2023-07-15 DIAGNOSIS — I2510 Atherosclerotic heart disease of native coronary artery without angina pectoris: Secondary | ICD-10-CM | POA: Diagnosis not present

## 2023-07-15 MED ORDER — ROSUVASTATIN CALCIUM 20 MG PO TABS: 20.0000 mg | ORAL_TABLET | Freq: Every day | ORAL | 3 refills | Status: DC

## 2023-07-15 NOTE — Assessment & Plan Note (Signed)
 Last LDL 88.  Would like LDL at least under 70 if not 55.  Increasing Crestor.

## 2023-07-15 NOTE — Patient Instructions (Addendum)
 Statin increased.  Follow up in 3 months.

## 2023-07-15 NOTE — Progress Notes (Signed)
 Subjective:  Patient ID: Brent Davis, male    DOB: [birth year]  Age: 68 y.o. MRN: [id number]  CC: Follow-up   HPI:  68 year old male presents for follow-up.  Patient still having dyspnea on exertion.  Had PET/CT perfusion study in December which was abnormal.  There was evidence of ischemia.  Patient was advised to stay active and be compliant with statin therapy.  No further workup was recommended.  Will discuss this today.  Patient following with pulmonology regarding bronchiectasis.  He states that he feels like he is doing well at this time.  Patient Active Problem List   Diagnosis Date Noted   Hyperlipidemia 07/15/2023   Allergic rhinitis 06/25/2023   CAD (coronary artery disease) 04/16/2023   Bronchiectasis (HCC) 04/16/2023   DOE (dyspnea on exertion) 08/26/2022   Bilateral sensorineural hearing loss 04/09/2022   Osteoarthritis of right knee 12/02/2021    Social Hx   Social History   Socioeconomic History   Marital status: Married    Spouse name: Not on file   Number of children: Not on file   Years of education: Not on file   Highest education level: 12th grade  Occupational History   Not on file  Tobacco Use   Smoking status: Former    Current packs/day: 0.00    Average packs/day: 1 pack/day for 28.0 years (28.0 ttl pk-yrs)    Types: Cigarettes    Start date: 23    Quit date: 2001    Years since quitting: 24.1   Smokeless tobacco: Never  Vaping Use   Vaping status: Never Used  Substance and Sexual Activity   Alcohol use: Yes    Comment: 2-3/week   Drug use: Never   Sexual activity: Yes    Birth control/protection: None  Other Topics Concern   Not on file  Social History Narrative   Not on file   Social Drivers of Health   Financial Resource Strain: Low Risk  (07/11/2023)   Overall Financial Resource Strain (CARDIA)    Difficulty of Paying Living Expenses: Not hard at all  Food Insecurity: No Food Insecurity (07/11/2023)   Hunger Vital Sign     Worried About Running Out of Food in the Last Year: Never true    Ran Out of Food in the Last Year: Never true  Transportation Needs: No Transportation Needs (07/11/2023)   PRAPARE - Administrator, Civil Service (Medical): No    Lack of Transportation (Non-Medical): No  Physical Activity: Sufficiently Active (07/11/2023)   Exercise Vital Sign    Days of Exercise per Week: 3 days    Minutes of Exercise per Session: 90 min  Stress: No Stress Concern Present (07/11/2023)   [employer] of Occupational Health - Occupational Stress Questionnaire    Feeling of Stress : Not at all  Social Connections: Unknown (07/11/2023)   Social Connection and Isolation Panel [NHANES]    Frequency of Communication with Friends and Family: Once a week    Frequency of Social Gatherings with Friends and Family: Once a week    Attends Religious Services: Patient declined    Database administrator or Organizations: No    Attends Engineer, structural: Patient declined    Marital Status: Married    Review of Systems Per HPI  Objective:  BP 134/80   Pulse (!) 56   Ht 5\' 10"  (1.778 m)   Wt 227 lb (103 kg)   SpO2 99%   BMI 32.57 kg/m  07/15/2023    9:45 AM 07/15/2023    9:02 AM 07/15/2023    9:01 AM  BP/Weight  Systolic BP 134 148 148  Diastolic BP 80 75 83  Wt. (Lbs)   227  BMI   32.57 kg/m2    Physical Exam Constitutional:      General: He is not in acute distress.    Appearance: Normal appearance.  HENT:     Head: Normocephalic and atraumatic.  Eyes:     General:        Right eye: No discharge.        Left eye: No discharge.     Conjunctiva/sclera: Conjunctivae normal.  Cardiovascular:     Rate and Rhythm: Normal rate and regular rhythm.  Pulmonary:     Effort: Pulmonary effort is normal.     Breath sounds: Normal breath sounds. No wheezing, rhonchi or rales.  Neurological:     Mental Status: He is alert.  Psychiatric:        Mood and Affect: Mood normal.         Behavior: Behavior normal.     Lab Results  Component Value Date   WBC 5.1 06/24/2023   HGB 14.6 06/24/2023   HCT 43.5 06/24/2023   PLT 193 06/24/2023   GLUCOSE 103 (H) 08/26/2022   CHOL 150 08/26/2022   TRIG 57 08/26/2022   HDL 50 08/26/2022   LDLCALC 88 08/26/2022   ALT 16 08/26/2022   AST 21 08/26/2022   NA 142 08/26/2022   K 5.0 08/26/2022   CL 103 08/26/2022   CREATININE 1.08 08/26/2022   BUN 15 08/26/2022   CO2 24 08/26/2022     Assessment & Plan:  Coronary artery disease involving native coronary artery of native heart without angina pectoris Assessment & Plan: I am concerned given abnormal stress test and known atherosclerosis/calcification of the LAD.  I have reached out to cardiology.  I would like further evaluation with CT coronary or catheterization to assess the extent of plaque in the LAD.  Awaiting response.   Hyperlipidemia, unspecified hyperlipidemia type Assessment & Plan: Last LDL 88.  Would like LDL at least under 70 if not 55.  Increasing Crestor.  Orders: -     Rosuvastatin Calcium; Take 1 tablet (20 mg total) by mouth daily.  Dispense: 90 tablet; Refill: 3  DOE (dyspnea on exertion) Assessment & Plan: I am concerned given abnormal stress test and known atherosclerosis/calcification of the LAD.  I have reached out to cardiology.  I would like further evaluation with CT coronary or catheterization to assess the extent of plaque in the LAD.  Awaiting response.     Follow-up:  3 months  Inda Mcglothen Adriana Simas DO [facility]

## 2023-07-15 NOTE — Assessment & Plan Note (Signed)
 I am concerned given abnormal stress test and known atherosclerosis/calcification of the LAD.  I have reached out to cardiology.  I would like further evaluation with CT coronary or catheterization to assess the extent of plaque in the LAD.  Awaiting response.

## 2023-07-28 ENCOUNTER — Ambulatory Visit: Attending: Internal Medicine | Admitting: Internal Medicine

## 2023-07-28 ENCOUNTER — Encounter: Payer: Self-pay | Admitting: Internal Medicine

## 2023-07-28 ENCOUNTER — Other Ambulatory Visit (HOSPITAL_COMMUNITY)
Admission: RE | Admit: 2023-07-28 | Discharge: 2023-07-28 | Disposition: A | Discharge status: Home / Self Care | Source: Ambulatory Visit | Attending: Internal Medicine | Admitting: Internal Medicine

## 2023-07-28 VITALS — BP 124/70 | HR 60 | Ht 70.0 in | Wt 223.2 lb

## 2023-07-28 DIAGNOSIS — Z01810 Encounter for preprocedural cardiovascular examination: Secondary | ICD-10-CM

## 2023-07-28 DIAGNOSIS — R0609 Other forms of dyspnea: Secondary | ICD-10-CM

## 2023-07-28 DIAGNOSIS — R0602 Shortness of breath: Secondary | ICD-10-CM

## 2023-07-28 LAB — CBC
HCT: 40.7 % (ref 39.0–52.0)
Hemoglobin: 13.8 g/dL (ref 13.0–17.0)
MCH: 30.9 pg (ref 26.0–34.0)
MCHC: 33.9 g/dL (ref 30.0–36.0)
MCV: 91.1 fL (ref 80.0–100.0)
Platelets: 176 10*3/uL (ref 150–400)
RBC: 4.47 MIL/uL (ref 4.22–5.81)
RDW: 13.3 % (ref 11.5–15.5)
WBC: 4.5 10*3/uL (ref 4.0–10.5)
nRBC: 0 % (ref 0.0–0.2)

## 2023-07-28 LAB — BASIC METABOLIC PANEL
Anion gap: 8 (ref 5–15)
BUN: 11 mg/dL (ref 8–23)
CO2: 26 mmol/L (ref 22–32)
Calcium: 8.3 mg/dL — ABNORMAL LOW (ref 8.9–10.3)
Chloride: 105 mmol/L (ref 98–111)
Creatinine, Ser: 0.96 mg/dL (ref 0.61–1.24)
GFR, Estimated: >60 mL/min (ref >60)
Glucose, Bld: 98 mg/dL (ref 70–99)
Potassium: 4.2 mmol/L (ref 3.5–5.1)
Sodium: 139 mmol/L (ref 135–145)

## 2023-07-28 MED ORDER — ASPIRIN 81 MG PO TBEC: 81.0000 mg | DELAYED_RELEASE_TABLET | Freq: Every day | ORAL | 3 refills | Status: DC

## 2023-07-28 NOTE — H&P (View-Only) (Signed)
 Cardiology Office Note   Date:  07/28/2023   ID:  Brent Davis, DOB 1955-06-01, MRN [id number]  PCP:  Brent Sams, DO  Cardiologist:   Brent Pates, MD   Patient here for f/u of  DOE    History of Present Illness: Brent Davis is a 68 y.o. 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, bringing meat in off the truck   INitially attributed to wt    No CP I saw him for the first time in July 2024   Scheduled Lexiscan PET/CT This ws done in Dec 2024  This showed a small area of mild distal anterior ischemia  Normal overall flow reserve  Plan was for medical Rx as he was feeling good   Started on statin   Seen in ER down near [place]   Dizzy one day  Severe     Imprved after one med given   Extensive testing (CT, MRI, carotid USN)  Ultimately felt to be vertigo    One previous spell 10 or 15 years like it    PT seen by Brent Davis in Feb 2025  Felt conditioning level contrib to DOE   Pt also with mod focal bronchiectasis in RLL  Also allergies  Since seen the pt says he is doing OK overall   He denies CP but says he still gets SOB with walking up inclines (garbage cans in driveway) or in working in yard (mowing, shoveling)   This has beenover the past 1.5 years   Prior to this he did not have these problems     Symptoms eased off with rest         Current Meds  Medication Sig   aspirin EC 81 MG tablet Take 1 tablet (81 mg total) by mouth daily. Swallow whole.   levocetirizine (XYZAL) 5 MG tablet Take 5 mg by mouth daily at 6 (six) AM.   rosuvastatin (CRESTOR) 20 MG tablet Take 1 tablet (20 mg total) by mouth daily.   tadalafil (CIALIS) 5 MG tablet Take 5 mg by mouth daily as needed for erectile dysfunction.     Allergies:   Penicillins   Past Medical History:  Diagnosis Date   Osteoarthritis of right knee     Past Surgical History:  Procedure Laterality Date   BACK SURGERY     COLONOSCOPY WITH  PROPOFOL N/A 05/02/2022   Procedure: COLONOSCOPY WITH PROPOFOL;  Surgeon: Brent Frame, MD;  [facility];  Service: Gastroenterology;  Laterality: N/A;  7:30am, asa 1-2   POLYPECTOMY  05/02/2022   Procedure: POLYPECTOMY INTESTINAL;  Surgeon: Brent Frame, MD;  Location: [facility];  Service: Gastroenterology;;     Social History:  The patient  reports that he quit smoking about 24 years ago. His smoking use included cigarettes. He started smoking about 52 years ago. He has a 28 pack-year smoking history. He has never used smokeless tobacco. He reports current alcohol use. He reports that he does not use drugs.   Family History:  The patient's family history includes Asthma in his sister and sister; Diabetes in his father; Epilepsy in his father.    ROS:  Please see the history of present illness. All other systems are reviewed and  Negative to the above problem except as noted.    PHYSICAL EXAM: VS:  BP 124/70 (BP Location: Left Arm, Patient  Position: Sitting, Cuff Size: Large)   Pulse 60   Ht 5\' 10"  (1.778 m)   Wt 101.2 kg   SpO2 97%   BMI 32.03 kg/m   GEN: Pt in NAD  HEENT: normal  Neck: no JVD, carotid bruits Cardiac: RRR; no murmurs,  No LE  edema  Respiratory:  clear to auscultation  Moving air well  No wheezes  GI: soft, nontender,No hepatomegaly    EKG:  EKG is not  ordered today.  Lexiscan PET/CT  July 2024    LV perfusion is abnormal. There is evidence of ischemia. There is no evidence of infarction. Defect 1: There is a small defect with mild reduction in uptake present in the apical inferior location(s) that is reversible. There is normal wall motion in the defect area. Consistent with ischemia.   Rest left ventricular function is normal. Rest EF: 59%. Stress left ventricular function is normal. Stress EF: 66%. End diastolic cavity size is normal. End systolic cavity size is normal.   Myocardial blood flow was computed to be  0.48ml/g/min at rest and 1.50ml/g/min at stress. Global myocardial blood flow reserve was 2.20 and was normal.   Coronary calcium was present on the attenuation correction CT images. Moderate coronary calcifications were present. Coronary calcifications were present in the left anterior descending artery distribution(s).   Findings are consistent with ischemia. The study is low risk.   MBFR abnormal in perfusion defect area at 1.87   Lipid Panel    Component Value Date/Time   CHOL 150 08/26/2022 0913   TRIG 57 08/26/2022 0913   HDL 50 08/26/2022 0913   CHOLHDL 3.0 08/26/2022 0913   LDLCALC 88 08/26/2022 0913      Wt Readings from Last 3 Encounters:  07/28/23 101.2 kg  07/15/23 103 kg  06/24/23 103.9 kg      ASSESSMENT AND PLAN:  1  Dyspnea on exertion. The pt has noted symptoms over the past about 1.5 years    No CP   Reproducible with certain types of more vigorous activity    CT of chest showed extensive calcifications of LAD  PET/CT stress test showed area of distal inferior ischemia   Felt to be low risk but flow reserve was down in this distribution.    Normal perfusion in other areas.     Today BP / HR both good   He is moving air well with no wheezes     I reviewed all of findings with patient   If there is a distal lesion with small amount of myocardium with ischemia medical Rx recommended   His BP and HR are very good (low ) at rest however which limit without developing hypotension  I also reviewed limitations of noninvasive testing     He is eager to get back to doing activity without problems, without limitations in breathing   I think defining anatomy with L heart cath and confirming filling pressues with R heart cath is reasonable   Reveiwed with pt and he agrees    Risks/benefits described  Pt understands and agrees to proceed with testing   He is not on ec ASA  Would start      2 HL  Pt on statin now   Crestor.  Dose increased by Dr Adriana Simas    Will get last labs      In 2 months from recent increase he should have Lipomed, ApoB and Lpa checkec        Will also  check A1C  Brent Pates MD   Current medicines are reviewed at length with the patient today.  The patient does not have concerns regarding medicines.  Signed, Brent Pates, MD  07/28/2023 9:33 AM    [facility] [address] [place], [place], [place]  [address] Phone: [phone]; Fax: [phone]

## 2023-07-28 NOTE — Addendum Note (Signed)
 Addended by: Eustace Moore on: 07/28/2023 04:00 PM   Modules accepted: Orders

## 2023-07-28 NOTE — Patient Instructions (Signed)
 Medication Instructions:  Your physician has recommended you make the following change in your medication:  Start aspirin 81 mg daily Continue all other medications as prescribed  Labwork: BMET & CBC today at [facility]  Testing/Procedures: Your physician has requested that you have a cardiac catheterization. Cardiac catheterization is used to diagnose and/or treat various heart conditions. Doctors may recommend this procedure for a number of different reasons. The most common reason is to evaluate chest pain. Chest pain can be a symptom of coronary artery disease (CAD), and cardiac catheterization can show whether plaque is narrowing or blocking your heart's arteries. This procedure is also used to evaluate the valves, as well as measure the blood flow and oxygen levels in different parts of your heart. For further information please visit [email or web address] Please follow instruction sheet, as given.  Follow-Up: Your physician recommends that you schedule a follow-up appointment in: 1 month  Any Other Special Instructions Will Be Listed Below (If Applicable).  If you need a refill on your cardiac medications before your next appointment, please call your pharmacy.   [facility] [address] [place] [address] Dept: [phone] Loc: [phone]  Brent Davis  07/28/2023  You are scheduled for a Cardiac Catheterization on Tuesday, March 25 with Dr. Peter [place].  1. Please arrive at the [facility] (Main Entrance A) at [facility]: [address] [place], [place] [address] at 8:30 AM (This time is 2 hour(s) before your procedure to ensure your preparation).   Free valet parking service is available. You will check in at ADMITTING. The support person will be asked to wait in the waiting room.  It is OK to have someone drop you off and come back when you are ready to be discharged.     Special note: Every effort is made to have your procedure done on time. Please understand that emergencies sometimes delay scheduled procedures.  2. Diet: Do not eat solid foods after midnight.  The patient may have clear liquids until 5am upon the day of the procedure.  3. Labs: You will need to have blood drawn on Tuesday, March 18 at [facility]. You do not need to be fasting.  4. Medication instructions in preparation for your procedure:   Contrast Allergy: No  On the morning of your procedure, take your Aspirin 81 mg and any morning medicines NOT listed above.  You may use sips of water.  5. Plan to go home the same day, you will only stay overnight if medically necessary. 6. Bring a current list of your medications and current insurance cards. 7. You MUST have a responsible person to drive you home. 8. Someone MUST be with you the first 24 hours after you arrive home or your discharge will be delayed. 9. Please wear clothes that are easy to get on and off and wear slip-on shoes.  Thank you for allowing [place] to care for you!   -- [facility]

## 2023-07-28 NOTE — Progress Notes (Signed)
 Cardiology Office Note   Date:  07/28/2023   ID:  Brent Davis, DOB 1955-06-01, MRN [id number]  PCP:  [name], DO  Cardiologist:   [name], MD   Patient here for f/u of  DOE    History of Present Illness: Brent Davis is a 68 y.o. 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, bringing meat in off the truck   INitially attributed to wt    No CP I saw him for the first time in July 2024   Scheduled Lexiscan PET/CT This ws done in Dec 2024  This showed a small area of mild distal anterior ischemia  Normal overall flow reserve  Plan was for medical Rx as he was feeling good   Started on statin   Seen in ER down near [place]   Dizzy one day  Severe     Imprved after one med given   Extensive testing (CT, MRI, carotid USN)  Ultimately felt to be vertigo    One previous spell 10 or 15 years like it    PT seen by Jerilee Hoh in Feb 2025  Felt conditioning level contrib to DOE   Pt also with mod focal bronchiectasis in RLL  Also allergies  Since seen the pt says he is doing OK overall   He denies CP but says he still gets SOB with walking up inclines (garbage cans in driveway) or in working in yard (mowing, shoveling)   This has beenover the past 1.5 years   Prior to this he did not have these problems     Symptoms eased off with rest         Current Meds  Medication Sig   aspirin EC 81 MG tablet Take 1 tablet (81 mg total) by mouth daily. Swallow whole.   levocetirizine (XYZAL) 5 MG tablet Take 5 mg by mouth daily at 6 (six) AM.   rosuvastatin (CRESTOR) 20 MG tablet Take 1 tablet (20 mg total) by mouth daily.   tadalafil (CIALIS) 5 MG tablet Take 5 mg by mouth daily as needed for erectile dysfunction.     Allergies:   Penicillins   Past Medical History:  Diagnosis Date   Osteoarthritis of right knee     Past Surgical History:  Procedure Laterality Date   BACK SURGERY     COLONOSCOPY WITH  PROPOFOL N/A 05/02/2022   Procedure: COLONOSCOPY WITH PROPOFOL;  Surgeon: Dolores Frame, MD;  [facility];  Service: Gastroenterology;  Laterality: N/A;  7:30am, asa 1-2   POLYPECTOMY  05/02/2022   Procedure: POLYPECTOMY INTESTINAL;  Surgeon: Dolores Frame, MD;  [facility];  Service: Gastroenterology;;     Social History:  The patient  reports that he quit smoking about 24 years ago. His smoking use included cigarettes. He started smoking about 52 years ago. He has a 28 pack-year smoking history. He has never used smokeless tobacco. He reports current alcohol use. He reports that he does not use drugs.   Family History:  The patient's family history includes Asthma in his sister and sister; Diabetes in his father; Epilepsy in his father.    ROS:  Please see the history of present illness. All other systems are reviewed and  Negative to the above problem except as noted.    PHYSICAL EXAM: VS:  BP 124/70 (BP Location: Left Arm, Patient  Position: Sitting, Cuff Size: Large)   Pulse 60   Ht 5\' 10"  (1.778 m)   Wt 101.2 kg   SpO2 97%   BMI 32.03 kg/m   GEN: Pt in NAD  HEENT: normal  Neck: no JVD, carotid bruits Cardiac: RRR; no murmurs,  No LE  edema  Respiratory:  clear to auscultation  Moving air well  No wheezes  GI: soft, nontender,No hepatomegaly    EKG:  EKG is not  ordered today.  Lexiscan PET/CT  July 2024    LV perfusion is abnormal. There is evidence of ischemia. There is no evidence of infarction. Defect 1: There is a small defect with mild reduction in uptake present in the apical inferior location(s) that is reversible. There is normal wall motion in the defect area. Consistent with ischemia.   Rest left ventricular function is normal. Rest EF: 59%. Stress left ventricular function is normal. Stress EF: 66%. End diastolic cavity size is normal. End systolic cavity size is normal.   Myocardial blood flow was computed to be  0.48ml/g/min at rest and 1.50ml/g/min at stress. Global myocardial blood flow reserve was 2.20 and was normal.   Coronary calcium was present on the attenuation correction CT images. Moderate coronary calcifications were present. Coronary calcifications were present in the left anterior descending artery distribution(s).   Findings are consistent with ischemia. The study is low risk.   MBFR abnormal in perfusion defect area at 1.87   Lipid Panel    Component Value Date/Time   CHOL 150 08/26/2022 0913   TRIG 57 08/26/2022 0913   HDL 50 08/26/2022 0913   CHOLHDL 3.0 08/26/2022 0913   LDLCALC 88 08/26/2022 0913      Wt Readings from Last 3 Encounters:  07/28/23 101.2 kg  07/15/23 103 kg  06/24/23 103.9 kg      ASSESSMENT AND PLAN:  1  Dyspnea on exertion. The pt has noted symptoms over the past about 1.5 years    No CP   Reproducible with certain types of more vigorous activity    CT of chest showed extensive calcifications of LAD  PET/CT stress test showed area of distal inferior ischemia   Felt to be low risk but flow reserve was down in this distribution.    Normal perfusion in other areas.     Today BP / HR both good   He is moving air well with no wheezes     I reviewed all of findings with patient   If there is a distal lesion with small amount of myocardium with ischemia medical Rx recommended   His BP and HR are very good (low ) at rest however which limit without developing hypotension  I also reviewed limitations of noninvasive testing     He is eager to get back to doing activity without problems, without limitations in breathing   I think defining anatomy with L heart cath and confirming filling pressues with R heart cath is reasonable   Reveiwed with pt and he agrees    Risks/benefits described  Pt understands and agrees to proceed with testing   He is not on ec ASA  Would start      2 HL  Pt on statin now   Crestor.  Dose increased by Dr Adriana Simas    Will get last labs      In 2 months from recent increase he should have Lipomed, ApoB and Lpa checkec        Will also  check A1C  [name] MD   Current medicines are reviewed at length with the patient today.  The patient does not have concerns regarding medicines.  Signed, [name], MD  07/28/2023 9:33 AM    [facility] [address] [place], [place], [place]  [address] Phone: [phone]; Fax: [phone]

## 2023-07-29 ENCOUNTER — Encounter: Payer: Self-pay | Admitting: Internal Medicine

## 2023-08-03 ENCOUNTER — Telehealth: Payer: Self-pay | Admitting: *Deleted

## 2023-08-03 NOTE — Telephone Encounter (Signed)
 Cardiac Catheterization scheduled at [facility] for: Tuesday August 04, 2023 10:30 AM Arrival time [facility] Main Entrance A at: 8:30 AM  Nothing to eat after midnight prior to procedure, clear liquids until 5 AM day of procedure.  Medication instructions: -Usual morning medications can be taken with sips of water including aspirin 81 mg.  Plan to go home the same day, you will only stay overnight if medically necessary.  You must have responsible adult to drive you home.  Someone must be with you the first 24 hours after you arrive home.  Reviewed procedure instructions with patient.

## 2023-08-04 ENCOUNTER — Encounter (HOSPITAL_COMMUNITY)
Admission: RE | Disposition: A | Discharge status: Home / Self Care | Payer: Self-pay | Source: Home / Self Care | Attending: Cardiology

## 2023-08-04 ENCOUNTER — Other Ambulatory Visit: Payer: Self-pay

## 2023-08-04 ENCOUNTER — Ambulatory Visit (HOSPITAL_COMMUNITY): Payer: No Typology Code available for payment source

## 2023-08-04 ENCOUNTER — Ambulatory Visit (HOSPITAL_COMMUNITY)
Admission: RE | Admit: 2023-08-04 | Discharge: 2023-08-04 | Disposition: A | Discharge status: Home / Self Care | Attending: Cardiology | Admitting: Cardiology

## 2023-08-04 ENCOUNTER — Encounter (HOSPITAL_COMMUNITY): Payer: Self-pay | Admitting: Cardiology

## 2023-08-04 DIAGNOSIS — I2584 Coronary atherosclerosis due to calcified coronary lesion: Secondary | ICD-10-CM | POA: Diagnosis not present

## 2023-08-04 DIAGNOSIS — Z87891 Personal history of nicotine dependence: Secondary | ICD-10-CM | POA: Diagnosis not present

## 2023-08-04 DIAGNOSIS — Z79899 Other long term (current) drug therapy: Secondary | ICD-10-CM | POA: Insufficient documentation

## 2023-08-04 DIAGNOSIS — R0609 Other forms of dyspnea: Secondary | ICD-10-CM | POA: Insufficient documentation

## 2023-08-04 DIAGNOSIS — E785 Hyperlipidemia, unspecified: Secondary | ICD-10-CM | POA: Insufficient documentation

## 2023-08-04 DIAGNOSIS — I2510 Atherosclerotic heart disease of native coronary artery without angina pectoris: Secondary | ICD-10-CM | POA: Insufficient documentation

## 2023-08-04 HISTORY — PX: RIGHT/LEFT HEART CATH AND CORONARY ANGIOGRAPHY: CATH118266

## 2023-08-04 LAB — POCT I-STAT EG7
Acid-Base Excess: 1 mmol/L (ref 0.0–2.0)
Acid-Base Excess: 1 mmol/L (ref 0.0–2.0)
Bicarbonate: 26.1 mmol/L (ref 20.0–28.0)
Bicarbonate: 26.1 mmol/L (ref 20.0–28.0)
Calcium, Ion: 1.12 mmol/L — ABNORMAL LOW (ref 1.15–1.40)
Calcium, Ion: 1.16 mmol/L (ref 1.15–1.40)
HCT: 38 % — ABNORMAL LOW (ref 39.0–52.0)
HCT: 39 % (ref 39.0–52.0)
Hemoglobin: 12.9 g/dL — ABNORMAL LOW (ref 13.0–17.0)
Hemoglobin: 13.3 g/dL (ref 13.0–17.0)
O2 Saturation: 68 %
O2 Saturation: 71 %
Potassium: 3.8 mmol/L (ref 3.5–5.1)
Potassium: 3.9 mmol/L (ref 3.5–5.1)
Sodium: 141 mmol/L (ref 135–145)
Sodium: 142 mmol/L (ref 135–145)
TCO2: 27 mmol/L (ref 22–32)
TCO2: 27 mmol/L (ref 22–32)
pCO2, Ven: 43.3 mmHg — ABNORMAL LOW (ref 44–60)
pCO2, Ven: 44.7 mmHg (ref 44–60)
pH, Ven: 7.375 (ref 7.25–7.43)
pH, Ven: 7.389 (ref 7.25–7.43)
pO2, Ven: 36 mmHg (ref 32–45)
pO2, Ven: 39 mmHg (ref 32–45)

## 2023-08-04 LAB — POCT I-STAT 7, (LYTES, BLD GAS, ICA,H+H)
Acid-Base Excess: 0 mmol/L (ref 0.0–2.0)
Bicarbonate: 24.9 mmol/L (ref 20.0–28.0)
Calcium, Ion: 1.13 mmol/L — ABNORMAL LOW (ref 1.15–1.40)
HCT: 38 % — ABNORMAL LOW (ref 39.0–52.0)
Hemoglobin: 12.9 g/dL — ABNORMAL LOW (ref 13.0–17.0)
O2 Saturation: 94 %
Potassium: 3.7 mmol/L (ref 3.5–5.1)
Sodium: 143 mmol/L (ref 135–145)
TCO2: 26 mmol/L (ref 22–32)
pCO2 arterial: 40.6 mmHg (ref 32–48)
pH, Arterial: 7.396 (ref 7.35–7.45)
pO2, Arterial: 73 mmHg — ABNORMAL LOW (ref 83–108)

## 2023-08-04 SURGERY — RIGHT/LEFT HEART CATH AND CORONARY ANGIOGRAPHY
Anesthesia: LOCAL

## 2023-08-04 MED ORDER — ACETAMINOPHEN 325 MG PO TABS: 650.0000 mg | ORAL_TABLET | ORAL | Status: DC | PRN

## 2023-08-04 MED ORDER — HEPARIN SODIUM (PORCINE) 1000 UNIT/ML IJ SOLN
INTRAMUSCULAR | Status: DC | PRN
  Administered 2023-08-04: 5000 [IU] via INTRAVENOUS

## 2023-08-04 MED ORDER — SODIUM CHLORIDE 0.9% FLUSH: 3.0000 mL | Freq: Two times a day (BID) | INTRAVENOUS | Status: DC

## 2023-08-04 MED ORDER — ONDANSETRON HCL 4 MG/2ML IJ SOLN: 4.0000 mg | Freq: Four times a day (QID) | INTRAMUSCULAR | Status: DC | PRN

## 2023-08-04 MED ORDER — SODIUM CHLORIDE 0.9% FLUSH: 3.0000 mL | INTRAVENOUS | Status: DC | PRN

## 2023-08-04 MED ORDER — VERAPAMIL HCL 2.5 MG/ML IV SOLN
INTRAVENOUS | Status: DC | PRN
  Administered 2023-08-04: 10 mL via INTRA_ARTERIAL

## 2023-08-04 MED ORDER — VERAPAMIL HCL 2.5 MG/ML IV SOLN
INTRAVENOUS | Status: AC
  Filled 2023-08-04: qty 2

## 2023-08-04 MED ORDER — IOHEXOL 350 MG/ML SOLN
INTRAVENOUS | Status: DC | PRN
Start: 2023-08-04 — End: 2023-08-04
  Administered 2023-08-04: 45 mL

## 2023-08-04 MED ORDER — SODIUM CHLORIDE 0.9 % IV SOLN
INTRAVENOUS | Status: DC | PRN
  Administered 2023-08-04: 10 mL/h via INTRAVENOUS

## 2023-08-04 MED ORDER — SODIUM CHLORIDE 0.9 % WEIGHT BASED INFUSION: 3.0000 mL/kg/h | INTRAVENOUS | Status: AC

## 2023-08-04 MED ORDER — ASPIRIN 81 MG PO CHEW: 81.0000 mg | CHEWABLE_TABLET | ORAL | Status: DC

## 2023-08-04 MED ORDER — HEPARIN SODIUM (PORCINE) 1000 UNIT/ML IJ SOLN
INTRAMUSCULAR | Status: AC
Start: 2023-08-04 — End: ?
  Filled 2023-08-04: qty 10

## 2023-08-04 MED ORDER — HEPARIN (PORCINE) IN NACL 1000-0.9 UT/500ML-% IV SOLN
INTRAVENOUS | Status: DC | PRN
  Administered 2023-08-04 (×2): 500 mL

## 2023-08-04 MED ORDER — LIDOCAINE HCL (PF) 1 % IJ SOLN
INTRAMUSCULAR | Status: AC
  Filled 2023-08-04: qty 30

## 2023-08-04 MED ORDER — LIDOCAINE HCL (PF) 1 % IJ SOLN
INTRAMUSCULAR | Status: DC | PRN
Start: 2023-08-04 — End: 2023-08-04
  Administered 2023-08-04: 2 mL
  Administered 2023-08-04: 1 mL

## 2023-08-04 MED ORDER — MIDAZOLAM HCL 2 MG/2ML IJ SOLN
INTRAMUSCULAR | Status: AC
  Filled 2023-08-04: qty 2

## 2023-08-04 MED ORDER — SODIUM CHLORIDE 0.9 % IV SOLN: 250.0000 mL | INTRAVENOUS | Status: DC | PRN

## 2023-08-04 MED ORDER — FENTANYL CITRATE (PF) 100 MCG/2ML IJ SOLN
INTRAMUSCULAR | Status: AC
  Filled 2023-08-04: qty 2

## 2023-08-04 MED ORDER — FENTANYL CITRATE (PF) 100 MCG/2ML IJ SOLN
INTRAMUSCULAR | Status: DC | PRN
  Administered 2023-08-04: 25 ug via INTRAVENOUS

## 2023-08-04 MED ORDER — SODIUM CHLORIDE 0.9 % WEIGHT BASED INFUSION: 1.0000 mL/kg/h | INTRAVENOUS | Status: DC

## 2023-08-04 MED ORDER — MIDAZOLAM HCL 2 MG/2ML IJ SOLN
INTRAMUSCULAR | Status: DC | PRN
  Administered 2023-08-04: 1 mg via INTRAVENOUS

## 2023-08-04 SURGICAL SUPPLY — 10 items
CATH 5FR JL3.5 JR4 ANG PIG MP (CATHETERS) IMPLANT
CATH SWAN GANZ 7F STRAIGHT (CATHETERS) IMPLANT
DEVICE RAD COMP TR BAND LRG (VASCULAR PRODUCTS) IMPLANT
GLIDESHEATH SLEND SS 6F .021 (SHEATH) IMPLANT
GLIDESHEATH SLENDER 7FR .021G (SHEATH) IMPLANT
GUIDEWIRE INQWIRE 1.5J.035X260 (WIRE) IMPLANT
INQWIRE 1.5J .035X260CM (WIRE) ×1 IMPLANT
PACK CARDIAC CATHETERIZATION (CUSTOM PROCEDURE TRAY) ×1 IMPLANT
SET ATX-X65L (MISCELLANEOUS) IMPLANT
SHEATH PROBE COVER 6X72 (BAG) IMPLANT

## 2023-08-04 NOTE — Progress Notes (Signed)
TR BAND REMOVAL  LOCATION:    right radial  DEFLATED PER PROTOCOL:    Yes.    TIME BAND OFF / DRESSING APPLIED:    1205 gauze dressing applied   SITE UPON ARRIVAL:    Level 0  SITE AFTER BAND REMOVAL:    Level 0  CIRCULATION SENSATION AND MOVEMENT:    Within Normal Limits   Yes.    COMMENTS:   no issues noted

## 2023-08-04 NOTE — Discharge Instructions (Signed)

## 2023-08-04 NOTE — Interval H&P Note (Signed)
 History and Physical Interval Note:  08/04/2023 9:39 AM  Brent Davis  has presented today for surgery, with the diagnosis of shortness of breath - abnormal pet scan.  The various methods of treatment have been discussed with the patient and family. After consideration of risks, benefits and other options for treatment, the patient has consented to  Procedure(s): RIGHT/LEFT HEART CATH AND CORONARY ANGIOGRAPHY (N/A) as a surgical intervention.  The patient's history has been reviewed, patient examined, no change in status, stable for surgery.  I have reviewed the patient's chart and labs.  Questions were answered to the patient's satisfaction.   Cath Lab Visit (complete for each Cath Lab visit)  Clinical Evaluation Leading to the Procedure:   ACS: No.  Non-ACS:    Anginal Classification: CCS III  Anti-ischemic medical therapy: No Therapy  Non-Invasive Test Results: Low-risk stress test findings: cardiac mortality <1%/year  Prior CABG: No previous CABG        Theron Arista [facility] 08/04/2023 9:39 AM

## 2023-08-21 ENCOUNTER — Ambulatory Visit (INDEPENDENT_AMBULATORY_CARE_PROVIDER_SITE_OTHER): Payer: No Typology Code available for payment source

## 2023-08-21 VITALS — Ht 70.0 in | Wt 225.0 lb

## 2023-08-21 DIAGNOSIS — Z0000 Encounter for general adult medical examination without abnormal findings: Secondary | ICD-10-CM

## 2023-08-21 NOTE — Progress Notes (Signed)
 Subjective:   Brent Davis is a 68 y.o. who presents for a Medicare Wellness preventive visit.  Visit Complete: Virtual I connected with  Brent Davis on 08/21/23 by a audio enabled telemedicine application and verified that I am speaking with the correct person using two identifiers.  Patient Location: Home  Provider Location: Home Office  I discussed the limitations of evaluation and management by telemedicine. The patient expressed understanding and agreed to proceed.  Vital Signs: Because this visit was a virtual/telehealth visit, some criteria may be missing or patient reported. Any vitals not documented were not able to be obtained and vitals that have been documented are patient reported.  VideoDeclined- This patient declined Librarian, academic. Therefore the visit was completed with audio only.  Persons Participating in Visit: Patient.  AWV Questionnaire: No: Patient Medicare AWV questionnaire was not completed prior to this visit.  Cardiac Risk Factors include: advanced age (>22men, >53 women);male gender;dyslipidemia     Objective:    Today's Vitals   08/21/23 0808  Weight: 225 lb (102.1 kg)  Height: 5\' 10"  (1.778 m)   Body mass index is 32.28 kg/m.     08/21/2023    9:12 AM 08/04/2023    8:43 AM 05/02/2022    6:34 AM  Advanced Directives  Does Patient Have a Medical Advance Directive? No No No  Would patient like information on creating a medical advance directive? Yes (MAU/Ambulatory/Procedural Areas - Information given) Yes (MAU/Ambulatory/Procedural Areas - Information given) Yes (MAU/Ambulatory/Procedural Areas - Information given)    Current Medications (verified) Outpatient Encounter Medications as of 08/21/2023  Medication Sig   aspirin EC 81 MG tablet Take 1 tablet (81 mg total) by mouth daily. Swallow whole.   ibuprofen (ADVIL) 800 MG tablet Take 800 mg by mouth daily as needed (pain.).   levocetirizine (XYZAL)  5 MG tablet Take 5 mg by mouth every evening.   loratadine (CLARITIN) 10 MG tablet Take 10 mg by mouth in the morning.   rosuvastatin (CRESTOR) 20 MG tablet Take 1 tablet (20 mg total) by mouth daily.   tadalafil (CIALIS) 5 MG tablet Take 5 mg by mouth daily as needed for erectile dysfunction.   No facility-administered encounter medications on file as of 08/21/2023.    Allergies (verified) Penicillins   History: Past Medical History:  Diagnosis Date   Osteoarthritis of right knee    Past Surgical History:  Procedure Laterality Date   BACK SURGERY     COLONOSCOPY WITH PROPOFOL N/A 05/02/2022   Procedure: COLONOSCOPY WITH PROPOFOL;  Surgeon: Dolores Frame, MD;  [facility];  Service: Gastroenterology;  Laterality: N/A;  7:30am, asa 1-2   POLYPECTOMY  05/02/2022   Procedure: POLYPECTOMY INTESTINAL;  Surgeon: Dolores Frame, MD;  Location: [facility];  Service: Gastroenterology;;   RIGHT/LEFT HEART CATH AND CORONARY ANGIOGRAPHY N/A 08/04/2023   Procedure: RIGHT/LEFT HEART CATH AND CORONARY ANGIOGRAPHY;  Surgeon: Swaziland, Peter M, MD;  Location: [facility] INVASIVE CV LAB;  Service: Cardiovascular;  Laterality: N/A;   Family History  Problem Relation Age of Onset   Diabetes Father    Epilepsy Father    Asthma Sister    Asthma Sister    Social History   Socioeconomic History   Marital status: Married    Spouse name: Not on file   Number of children: Not on file   Years of education: Not on file   Highest education level: 12th grade  Occupational History  Not on file  Tobacco Use   Smoking status: Former    Current packs/day: 0.00    Average packs/day: 1 pack/day for 28.0 years (28.0 ttl pk-yrs)    Types: Cigarettes    Start date: 64    Quit date: 2001    Years since quitting: 24.2   Smokeless tobacco: Never  Vaping Use   Vaping status: Never Used  Substance and Sexual Activity   Alcohol use: Yes    Comment: 2-3/week   Drug use: Never    Sexual activity: Yes    Birth control/protection: None  Other Topics Concern   Not on file  Social History Narrative   Not on file   Social Drivers of Health   Financial Resource Strain: Low Risk  (08/21/2023)   Overall Financial Resource Strain (CARDIA)    Difficulty of Paying Living Expenses: Not hard at all  Food Insecurity: No Food Insecurity (08/21/2023)   Hunger Vital Sign    Worried About Running Out of Food in the Last Year: Never true    Ran Out of Food in the Last Year: Never true  Transportation Needs: No Transportation Needs (08/21/2023)   PRAPARE - Administrator, Civil Service (Medical): No    Lack of Transportation (Non-Medical): No  Physical Activity: Sufficiently Active (08/21/2023)   Exercise Vital Sign    Days of Exercise per Week: 3 days    Minutes of Exercise per Session: 90 min  Stress: No Stress Concern Present (08/21/2023)   [employer] of Occupational Health - Occupational Stress Questionnaire    Feeling of Stress : Not at all  Social Connections: Socially Isolated (08/21/2023)   Social Connection and Isolation Panel [NHANES]    Frequency of Communication with Friends and Family: Once a week    Frequency of Social Gatherings with Friends and Family: Once a week    Attends Religious Services: Never    Database administrator or Organizations: No    Attends Engineer, structural: Never    Marital Status: Married    Tobacco Counseling Counseling given: Not Answered    Clinical Intake:  Pre-visit preparation completed: Yes  Pain : No/denies pain     Diabetes: No  No results found for: "HGBA1C"   How often do you need to have someone help you when you read instructions, pamphlets, or other written materials from your doctor or pharmacy?: 1 - Never  Interpreter Needed?: No  Information entered by :: Kandis Fantasia LPN   Activities of Daily Living     08/21/2023    8:09 AM  In your present state of health, do you  have any difficulty performing the following activities:  Hearing? 0  Vision? 0  Difficulty concentrating or making decisions? 0  Walking or climbing stairs? 0  Dressing or bathing? 0  Doing errands, shopping? 0  Preparing Food and eating ? N  Using the Toilet? N  In the past six months, have you accidently leaked urine? N  Do you have problems with loss of bowel control? N  Managing your Medications? N  Managing your Finances? N  Housekeeping or managing your Housekeeping? N    Patient Care Team: [name], DO as PCP - General (Family Medicine) Pricilla Riffle, MD as PCP - Cardiology (Cardiology) Nyoka Cowden, MD as Consulting Physician (Pulmonary Disease)  Indicate any recent Medical Services you may have received from other than [facility] providers in the past year (date may be approximate).  Assessment:   This is a routine wellness examination for Brent Davis.  Hearing/Vision screen Hearing Screening - Comments:: Some hearing loss   Vision Screening - Comments:: No vision problems; will schedule routine eye exam soon     Goals Addressed             This Visit's Progress    Remain active and independent         Depression Screen     08/21/2023    9:11 AM 07/15/2023    9:03 AM 04/16/2023   11:01 AM 08/26/2022    8:25 AM 03/31/2022    3:24 PM  PHQ 2/9 Scores  PHQ - 2 Score 0 0 0 0 0  PHQ- 9 Score 0 0 0 0     Fall Risk     08/21/2023    9:12 AM 07/15/2023    9:04 AM 04/16/2023   11:01 AM 03/31/2022    3:24 PM  Fall Risk   Falls in the past year? 0 0 0 0  Number falls in past yr: 0 0 0 0  Injury with Fall? 0 0 0 0  Risk for fall due to : No Fall Risks No Fall Risks No Fall Risks   Follow up Falls prevention discussed;Education provided;Falls evaluation completed Falls evaluation completed Falls evaluation completed     MEDICARE RISK AT HOME:  Medicare Risk at Home Any stairs in or around the home?: No If so, are there any without handrails?: No Home free  of loose throw rugs in walkways, pet beds, electrical cords, etc?: Yes Adequate lighting in your home to reduce risk of falls?: Yes Life alert?: No Use of a cane, walker or w/c?: No Grab bars in the bathroom?: Yes Shower chair or bench in shower?: No Elevated toilet seat or a handicapped toilet?: Yes  TIMED UP AND GO:  Was the test performed?  No  Cognitive Function: 6CIT completed        08/21/2023    9:13 AM  6CIT Screen  What Year? 0 points  What month? 0 points  What time? 0 points  Count back from 20 0 points  Months in reverse 0 points  Repeat phrase 0 points  Total Score 0 points    Immunizations  There is no immunization history on file for this patient.  Screening Tests Health Maintenance  Topic Date Due   Hepatitis C Screening  Never done   Zoster Vaccines- Shingrix (1 of 2) 10/15/2023 (Originally 03/20/2005)   DTaP/Tdap/Td (1 - Tdap) 07/14/2024 (Originally 03/20/1974)   Pneumonia Vaccine 64+ Years old (1 of 2 - PCV) 07/14/2024 (Originally 03/20/1974)   INFLUENZA VACCINE  12/11/2023   Medicare Annual Wellness (AWV)  08/20/2024   Colonoscopy  05/02/2032   HPV VACCINES  Aged Out   Meningococcal B Vaccine  Aged Out   COVID-19 Vaccine  Discontinued    Health Maintenance  Health Maintenance Due  Topic Date Due   Hepatitis C Screening  Never done   Health Maintenance Items Addressed: Patient declines vaccines   Additional Screening:  Vision Screening: Recommended annual ophthalmology exams for early detection of glaucoma and other disorders of the eye.  Dental Screening: Recommended annual dental exams for proper oral hygiene  Community Resource Referral / Chronic Care Management: CRR required this visit?  No   CCM required this visit?  No     Plan:     I have personally reviewed and noted the following in the patient's chart:   Medical  and social history Use of alcohol, tobacco or illicit drugs  Current medications and supplements including  opioid prescriptions. Patient is not currently taking opioid prescriptions. Functional ability and status Nutritional status Physical activity Advanced directives List of other physicians Hospitalizations, surgeries, and ER visits in previous 12 months Vitals Screenings to include cognitive, depression, and falls Referrals and appointments  In addition, I have reviewed and discussed with patient certain preventive protocols, quality metrics, and best practice recommendations. A written personalized care plan for preventive services as well as general preventive health recommendations were provided to patient.     Kandis Fantasia [place], [place]   9/52/8413   After Visit Summary: (MyChart) Due to this being a telephonic visit, the after visit summary with patients personalized plan was offered to patient via MyChart   Notes: Nothing significant to report at this time.

## 2023-08-21 NOTE — Patient Instructions (Signed)
 Brent Davis , Thank you for taking time to come for your Medicare Wellness Visit. I appreciate your ongoing commitment to your health goals. Please review the following plan we discussed and let me know if I can assist you in the future.   Referrals/Orders/Follow-Ups/Clinician Recommendations: Aim for 30 minutes of exercise or brisk walking, 6-8 glasses of water, and 5 servings of fruits and vegetables each day.  This is a list of the screening recommended for you and due dates:  Health Maintenance  Topic Date Due   Hepatitis C Screening  Never done   Zoster (Shingles) Vaccine (1 of 2) 10/15/2023*   DTaP/Tdap/Td vaccine (1 - Tdap) 07/14/2024*   Pneumonia Vaccine (1 of 2 - PCV) 07/14/2024*   Flu Shot  12/11/2023   Medicare Annual Wellness Visit  08/20/2024   Colon Cancer Screening  05/02/2032   HPV Vaccine  Aged Out   Meningitis B Vaccine  Aged Out   COVID-19 Vaccine  Discontinued  *Topic was postponed. The date shown is not the original due date.    Advanced directives: (ACP Link)Information on Advanced Care Planning can be found at [facility] Advance Health Care Directives Advance Health Care Directives. [email or web address]   Next Medicare Annual Wellness Visit scheduled for next year: Yes

## 2023-08-25 NOTE — Progress Notes (Signed)
 Cardiology Office Note:  .   Date:  09/08/2023  ID:  Brent Davis, DOB 1955/12/16, MRN [id number] PCP: Cook, Jayce G, DO  [facility] Providers Cardiologist:  Ola Berger, MD    History of Present Illness: .   Brent Davis is a 68 y.o. male with history of DOE and abn PET CT small area of mild distal ant ischemia. [facility] 07/2023 10% LAD normal LV function, normal LV filling pressures, normal right heart pressures.  Patient here for f/u. 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   ROS:    Studies Reviewed: Aaron Aas         Prior CV Studies:    R/L heart cath 07/2023   Prox LAD lesion is 10% stenosed.   The left ventricular systolic function is normal.   LV end diastolic pressure is normal.   The left ventricular ejection fraction is 55-65% by visual estimate.   No significant CAD. Some calcification in proximal LAD Normal LV function Normal LV filling pressures. LVEDP 9 mm Hg. PCWP 19/13, mean 12 mm Hg Normal right heart pressures. PAP 31/17, mean 25 mm Hg Normal cardiac output. 6.48 L/min, index 2.95   Plan: medical management.   PET scan 04/2023 Narrative & Impression     LV perfusion is abnormal. There is evidence of ischemia. There is no evidence of infarction. Defect 1: There is a small defect with mild reduction in uptake present in the apical inferior location(s) that is reversible. There is normal wall motion in the defect area. Consistent with ischemia.   Rest left ventricular function is normal. Rest EF: 59%. Stress left ventricular function is normal. Stress EF: 66%. End diastolic cavity size is normal. End systolic cavity size is normal.   Myocardial blood flow was computed to be 0.4ml/g/min at rest and 1.87ml/g/min at stress. Global myocardial blood flow reserve was 2.20 and was normal.   Coronary calcium  was present on the attenuation correction CT  images. Moderate coronary calcifications were present. Coronary calcifications were present in the left anterior descending artery distribution(s).   Findings are consistent with ischemia. The study is low risk.   MBFR abnormal in perfusion defect area at 1.87    Risk Assessment/Calculations:             Physical Exam:   VS:  BP 128/70 (BP Location: Left Arm, Cuff Size: Normal)   Pulse (!) 52   Ht 5\' 10"  (1.778 m)   Wt 213 lb 9.6 oz (96.9 kg)   SpO2 96%   BMI 30.65 kg/m    Wt Readings from Last 3 Encounters:  09/08/23 213 lb 9.6 oz (96.9 kg)  08/21/23 225 lb (102.1 kg)  08/04/23 225 lb (102.1 kg)    GEN: Well nourished, well developed in no acute distress NECK: No JVD; No carotid bruits CARDIAC:  RRR, no murmurs, rubs, gallops RESPIRATORY:  Clear to auscultation without rales, wheezing or rhonchi  ABDOMEN: Soft, non-tender, non-distended EXTREMITIES:  No edema; No deformity   ASSESSMENT AND PLAN: .     DOE and abn PET CT small area of mild dista ant ischemia. [facility] 07/2023 10% LAD normal LV function, normal LV filling pressures, normal right heart pressures. Feels great, DOE has improved, exercising more and thinks it may be seasonal. On ASA & Crestor . F/u in 1 yr.   HLD now on Crestor   Dispo: f/u in 1 yr  Signed, Theotis Flake, PA-C

## 2023-08-28 ENCOUNTER — Other Ambulatory Visit: Payer: Self-pay | Admitting: Family Medicine

## 2023-09-07 ENCOUNTER — Ambulatory Visit: Admitting: Physician Assistant

## 2023-09-08 ENCOUNTER — Encounter: Payer: Self-pay | Admitting: Physician Assistant

## 2023-09-08 ENCOUNTER — Ambulatory Visit: Attending: Physician Assistant | Admitting: Physician Assistant

## 2023-09-08 VITALS — BP 128/70 | HR 52 | Ht 70.0 in | Wt 213.6 lb

## 2023-09-08 DIAGNOSIS — R0609 Other forms of dyspnea: Secondary | ICD-10-CM | POA: Diagnosis not present

## 2023-09-08 DIAGNOSIS — E785 Hyperlipidemia, unspecified: Secondary | ICD-10-CM

## 2023-09-08 DIAGNOSIS — I2510 Atherosclerotic heart disease of native coronary artery without angina pectoris: Secondary | ICD-10-CM

## 2023-09-08 DIAGNOSIS — I2583 Coronary atherosclerosis due to lipid rich plaque: Secondary | ICD-10-CM | POA: Diagnosis not present

## 2023-09-08 NOTE — Patient Instructions (Signed)
 Medication Instructions:  Your physician recommends that you continue on your current medications as directed. Please refer to the Current Medication list given to you today.  *If you need a refill on your cardiac medications before your next appointment, please call your pharmacy*  Lab Work: NONE   If you have labs (blood work) drawn today and your tests are completely normal, you will receive your results only by: MyChart Message (if you have MyChart) OR A paper copy in the mail If you have any lab test that is abnormal or we need to change your treatment, we will call you to review the results.  Testing/Procedures: NONE   Follow-Up: At [facility], you and your health needs are our priority.  As part of our continuing mission to provide you with exceptional heart care, our providers are all part of one team.  This team includes your primary Cardiologist (physician) and Advanced Practice Providers or APPs (Physician Assistants and Nurse Practitioners) who all work together to provide you with the care you need, when you need it.  Your next appointment:   1 year(s)  Provider:   You may see Ola Berger, MD or one of the following Advanced Practice Providers on your designated Care Team:   Woodfin Hays, PA-C  [place], [place] Theotis Flake, [place]     We recommend signing up for the patient portal called "MyChart".  Sign up information is provided on this After Visit Summary.  MyChart is used to connect with patients for Virtual Visits (Telemedicine).  Patients are able to view lab/test results, encounter notes, upcoming appointments, etc.  Non-urgent messages can be sent to your provider as well.   To learn more about what you can do with MyChart, go to [email or web address].   Other Instructions Thank you for choosing [facility]!

## 2023-09-15 ENCOUNTER — Ambulatory Visit: Payer: No Typology Code available for payment source | Admitting: Internal Medicine

## 2023-09-15 ENCOUNTER — Ambulatory Visit (HOSPITAL_COMMUNITY)

## 2023-09-27 NOTE — Progress Notes (Deleted)
 Brent Davis, male    DOB: 07/13/1955    MRN: [id number]   Brief patient profile:  68  yowm  from [place] recalls severe pna at least twice as teenager once needed admit at age 68  stopped smoking 2001 s obvious sequelae  referred to pulmonary clinic in [place]  06/24/2023 by Dr Debrah Fan  for bronchiectasis  dx on w/u for doe since 2021 .    Spring summer allergic rhinitis while in [place] starting in his 29s never complicated by  need for  inhalers   seems gradually worse    History of Present Illness  06/24/2023  Pulmonary/ 1st office eval/ [facility] Kos[facility] / [facility]  Chief Complaint  Patient presents with   Consult    Bronchiectasis, shortness of breathe  Dyspnea: limited to 10 min pushing mower with variabledoe to mb and back = 100 ft slt incline back s stopping then sob ever since moving from [place] ( 6 y prior to OV )  where grade was flat but same distance so probably not "new onset" and has not really changed much since lived there  Cough: none including am  Sleep: sleeps flat one pillow  hears wheeze "forever" does not keep him up  SABA use: none  02: none  Rec     09/29/2023  f/u ov/[place] office/Jorrell Kuster re: *** maint on ***  No chief complaint on file.   Dyspnea:  *** Cough: *** Sleeping: ***   resp cc  SABA use: *** 02: ***  Lung cancer screening: ***   No obvious day to day or daytime variability or assoc excess/ purulent sputum or mucus plugs or hemoptysis or cp or chest tightness, subjective wheeze or overt sinus or hb symptoms.    Also denies any obvious fluctuation of symptoms with weather or environmental changes or other aggravating or alleviating factors except as outlined above   No unusual exposure hx or h/o childhood  asthma or knowledge of premature birth.  Current Allergies, Complete Past Medical History, Past Surgical History, Family History, and Social History were reviewed in [employer] record.  ROS  The following are not  active complaints unless bolded Hoarseness, sore throat, dysphagia, dental problems, itching, sneezing,  nasal congestion or discharge of excess mucus or purulent secretions, ear ache,   fever, chills, sweats, unintended wt loss or wt gain, classically pleuritic or exertional cp,  orthopnea pnd or arm/hand swelling  or leg swelling, presyncope, palpitations, abdominal pain, anorexia, nausea, vomiting, diarrhea  or change in bowel habits or change in bladder habits, change in stools or change in urine, dysuria, hematuria,  rash, arthralgias, visual complaints, headache, numbness, weakness or ataxia or problems with walking or coordination,  change in mood or  memory.        No outpatient medications have been marked as taking for the 09/29/23 encounter (Appointment) with Diamond Formica, MD.             Past Medical History:  Diagnosis Date   Osteoarthritis of right knee       Objective:    Wts   09/29/2023       ***   09/08/23 213 lb 9.6 oz (96.9 kg)  08/21/23 225 lb (102.1 kg)  08/04/23 225 lb (102.1 kg)      Vital signs reviewed  09/29/2023  - Note at rest 02 sats  ***% on ***   General appearance:    ***       top denture/ lower  partial    Min bar***    I personally reviewed images and agree with radiology impression as follows:   Chest CT w/o contrast 12/09/22    1. Focal moderate varicoid bronchiectasis in the posteromedial right lower lobe with associated patchy tree-in-bud opacity and peribronchovascular nodularity up to 0.7 cm, probably infectious or inflammatory such as due to recurrent aspiration or atypical mycobacterial infection (MAI).   2. No evidence of interstitial lung disease. 3. One vessel coronary atherosclerosis. 4.  Aortic Atherosclerosis (ICD10-I70.0)  CT chest 04/29/23 on coronary cuts: No change       Assessment

## 2023-09-29 ENCOUNTER — Ambulatory Visit: Payer: No Typology Code available for payment source | Admitting: Internal Medicine

## 2023-10-07 ENCOUNTER — Ambulatory Visit
Admission: EM | Admit: 2023-10-07 | Discharge: 2023-10-07 | Disposition: A | Discharge status: Home / Self Care | Attending: Nurse Practitioner | Admitting: Nurse Practitioner

## 2023-10-07 DIAGNOSIS — J069 Acute upper respiratory infection, unspecified: Secondary | ICD-10-CM | POA: Diagnosis not present

## 2023-10-07 MED ORDER — PROMETHAZINE-DM 6.25-15 MG/5ML PO SYRP: 5.0000 mL | ORAL_SOLUTION | Freq: Four times a day (QID) | ORAL | 0 refills | Status: DC | PRN

## 2023-10-07 NOTE — ED Triage Notes (Signed)
 Pt reports he cough, chills, and runny nose x 5 days   Took nyquil

## 2023-10-07 NOTE — ED Provider Notes (Signed)
 [facility]    CSN: [id number] Arrival date & time: 10/07/23  1043      History   Chief Complaint No chief complaint on file.   HPI Brent Davis is a 68 y.o. male.   The history is provided by the patient.   Patient presents with a 3-day history of runny nose and cough.  Patient states that his symptoms are beginning to improve but he continues to have a nagging cough.  Patient denies fever, chills, headache, ear pain, wheezing, shortness of breath, difficulty breathing, abdominal pain, nausea, vomiting, diarrhea, or rash.  Patient states he has been taking over-the-counter cough and cold medications for his symptoms with minimal relief.  Denies any obvious known sick contacts.  Patient declines COVID testing today. Past Medical History:  Diagnosis Date   Osteoarthritis of right knee     Patient Active Problem List   Diagnosis Date Noted   Hyperlipidemia 07/15/2023   Allergic rhinitis 06/25/2023   CAD (coronary artery disease) 04/16/2023   Bronchiectasis (HCC) 04/16/2023   DOE (dyspnea on exertion) 08/26/2022   Bilateral sensorineural hearing loss 04/09/2022   Osteoarthritis of right knee 12/02/2021    Past Surgical History:  Procedure Laterality Date   BACK SURGERY     COLONOSCOPY WITH PROPOFOL  N/A 05/02/2022   Procedure: COLONOSCOPY WITH PROPOFOL ;  Surgeon: Urban Garden, MD;  Location: [facility];  Service: Gastroenterology;  Laterality: N/A;  7:30am, asa 1-2   POLYPECTOMY  05/02/2022   Procedure: POLYPECTOMY INTESTINAL;  Surgeon: Urban Garden, MD;  Location: [facility];  Service: Gastroenterology;;   RIGHT/LEFT HEART CATH AND CORONARY ANGIOGRAPHY N/A 08/04/2023   Procedure: RIGHT/LEFT HEART CATH AND CORONARY ANGIOGRAPHY;  Surgeon: Sw[place], Peter M, MD;  Location: [facility];  Service: Cardiovascular;  Laterality: N/A;       Home Medications    Prior to Admission medications   Medication Sig Start Date End  Date Taking? Authorizing Provider  promethazine -dextromethorphan (PROMETHAZINE -DM) 6.25-15 MG/5ML syrup Take 5 mLs by mouth 4 (four) times daily as needed. 10/07/23  Yes Leath-Warren, Belen Bowers, NP  aspirin  EC 81 MG tablet Take 1 tablet (81 mg total) by mouth daily. Swallow whole. 07/28/23   Elmyra Haggard, MD  levocetirizine (XYZAL ) 5 MG tablet TAKE 1 TABLET BY MOUTH EVERY DAY IN THE EVENING 08/28/23   Cook, Jayce G, DO  loratadine (CLARITIN) 10 MG tablet Take 10 mg by mouth in the morning.    [provider]  rosuvastatin  (CRESTOR ) 20 MG tablet Take 1 tablet (20 mg total) by mouth daily. 07/15/23   Cook, Jayce G, DO    Family History Family History  Problem Relation Age of Onset   Diabetes Father    Epilepsy Father    Asthma Sister    Asthma Sister     Social History Social History   Tobacco Use   Smoking status: Former    Current packs/day: 0.00    Average packs/day: 1 pack/day for 28.0 years (28.0 ttl pk-yrs)    Types: Cigarettes    Start date: 28    Quit date: 2001    Years since quitting: 24.4   Smokeless tobacco: Never  Vaping Use   Vaping status: Never Used  Substance Use Topics   Alcohol use: Yes    Comment: 2-3/week   Drug use: Never     Allergies   Penicillins   Review of Systems Review of Systems Per HPI     BP 132/76  Systolic BP Percentile      Diastolic BP Percentile      Pulse Rate 61     Resp 20     Temp 98.3 F (36.8 C)     Temp Source Oral     SpO2 94 %     Weight      Height      Head Circumference      Peak Flow      Pain Score 0     Pain Loc      Pain Education      Exclude from Growth Chart    No data found.  Updated Vital Signs BP 132/76 (BP Location: Right Arm)   Pulse 61   Temp 98.3 F (36.8 C) (Oral)   Resp 20   SpO2 94%   Visual Acuity Right Eye Distance:   Left Eye Distance:   Bilateral Distance:    Right Eye Near:   Left Eye Near:    Bilateral Near:     Physical Exam Vitals and nursing note  reviewed.  Constitutional:      General: He is not in acute distress.    Appearance: Normal appearance.  HENT:     Head: Normocephalic.     Right Ear: Tympanic membrane, ear canal and external ear normal.     Left Ear: Tympanic membrane, ear canal and external ear normal.     Nose: Nose normal.     Right Turbinates: Enlarged and swollen.     Left Turbinates: Enlarged and swollen.     Right Sinus: No maxillary sinus tenderness or frontal sinus tenderness.     Left Sinus: No maxillary sinus tenderness or frontal sinus tenderness.     Mouth/Throat:     Lips: Pink.     Mouth: Mucous membranes are moist.     Pharynx: Postnasal drip present. No pharyngeal swelling, oropharyngeal exudate, posterior oropharyngeal erythema or uvula swelling.     Comments: Cobblestoning present to posterior oropharynx  Eyes:     Extraocular Movements: Extraocular movements intact.     Conjunctiva/sclera: Conjunctivae normal.     Pupils: Pupils are equal, round, and reactive to light.  Cardiovascular:     Rate and Rhythm: Normal rate and regular rhythm.     Pulses: Normal pulses.     Heart sounds: Normal heart sounds.  Pulmonary:     Effort: Pulmonary effort is normal.     Breath sounds: Normal breath sounds.  Abdominal:     General: Bowel sounds are normal.     Palpations: Abdomen is soft.     Tenderness: There is no abdominal tenderness.  Musculoskeletal:     Cervical back: Normal range of motion.  Lymphadenopathy:     Cervical: No cervical adenopathy.  Skin:    General: Skin is warm and dry.  Neurological:     General: No focal deficit present.     Mental Status: He is alert and oriented to person, place, and time.  Psychiatric:        Mood and Affect: Mood normal.        Behavior: Behavior normal.      UC Treatments / Results  Labs (all labs ordered are listed, but only abnormal results are displayed) Labs Reviewed - No data to display   EKG   Radiology No results  found.  Procedures Procedures (including critical care time)  Medications Ordered in UC Medications - No data to display  Initial Impression / Assessment and Plan /  UC Course  I have reviewed the triage vital signs and the nursing notes.  Pertinent labs & imaging results that were available during my care of the patient were reviewed by me and considered in my medical decision making (see chart for details).  Patient presents for 3-day history of cough and runny nose.  On exam, the patient is well-appearing, vital signs are stable.  Patient has been afebrile since symptoms started.  Patient declines COVID testing today.  Symptoms are consistent with a viral URI with cough.  Will provide symptomatic treatment with Promethazine  DM.  Supportive care recommendations were provided and discussed with the patient to include over-the-counter analgesics, use of a humidifier, and sleeping elevated.  Discussed indications with patient regarding follow-up.  Patient was in agreement with this plan of care and verbalizes understanding.  All questions were answered.  Patient stable for discharge.  Final Clinical Impressions(s) / UC Diagnoses   Final diagnoses:  Viral URI with cough     Discharge Instructions      Take medication as prescribed. Increase fluids allow for plenty of rest. You may take over-the-counter Tylenol  as needed for pain, fever, or general discomfort. Recommend use of a humidifier at nighttime during sleep and sleeping elevated on pillows while symptoms persist. As discussed, if symptoms have not improved over the next 5 to 7 days, or appear to be worsening, you may follow-up in this clinic or with your primary care physician for further evaluation. Follow-up as needed.   ED Prescriptions     Medication Sig Dispense Auth. Provider   promethazine -dextromethorphan (PROMETHAZINE -DM) 6.25-15 MG/5ML syrup Take 5 mLs by mouth 4 (four) times daily as needed. 118 mL Leath-Warren,  Belen Bowers, NP      PDMP not reviewed this encounter.   Hardy Lia, NP 10/07/23 1124

## 2023-10-07 NOTE — Discharge Instructions (Signed)
 Take medication as prescribed. Increase fluids allow for plenty of rest. You may take over-the-counter Tylenol  as needed for pain, fever, or general discomfort. Recommend use of a humidifier at nighttime during sleep and sleeping elevated on pillows while symptoms persist. As discussed, if symptoms have not improved over the next 5 to 7 days, or appear to be worsening, you may follow-up in this clinic or with your primary care physician for further evaluation. Follow-up as needed.

## 2023-10-16 ENCOUNTER — Ambulatory Visit: Admitting: Family Medicine

## 2023-10-16 VITALS — BP 122/68 | HR 62 | Temp 98.2°F | Ht 70.0 in | Wt 212.0 lb

## 2023-10-16 DIAGNOSIS — R0609 Other forms of dyspnea: Secondary | ICD-10-CM | POA: Diagnosis not present

## 2023-10-16 DIAGNOSIS — I2510 Atherosclerotic heart disease of native coronary artery without angina pectoris: Secondary | ICD-10-CM

## 2023-10-16 DIAGNOSIS — E785 Hyperlipidemia, unspecified: Secondary | ICD-10-CM | POA: Diagnosis not present

## 2023-10-19 NOTE — Assessment & Plan Note (Signed)
 Fair control.  Continue Crestor .

## 2023-10-19 NOTE — Progress Notes (Signed)
 Subjective:  Patient ID: Brent Davis, male    DOB: [birth year]  Age: 68 y.o. MRN: [id number]  CC:   Chief Complaint  Patient presents with   87month follow up    HPI:  68 year old male presents for follow-up.  Patient reports that he is doing well.  Had cardiac cath with minimal coronary artery disease.  Shortness of breath has improved.  Fair control of lipids on Crestor .  Would like better control.  He is compliant with medication.  Patient Active Problem List   Diagnosis Date Noted   Hyperlipidemia 07/15/2023   Allergic rhinitis 06/25/2023   CAD (coronary artery disease) 04/16/2023   Bronchiectasis (HCC) 04/16/2023   DOE (dyspnea on exertion) 08/26/2022   Bilateral sensorineural hearing loss 04/09/2022   Osteoarthritis of right knee 12/02/2021    Social Hx   Social History   Socioeconomic History   Marital status: Married    Spouse name: Not on file   Number of children: Not on file   Years of education: Not on file   Highest education level: 12th grade  Occupational History   Not on file  Tobacco Use   Smoking status: Former    Current packs/day: 0.00    Average packs/day: 1 pack/day for 28.0 years (28.0 ttl pk-yrs)    Types: Cigarettes    Start date: 71    Quit date: 2001    Years since quitting: 24.4   Smokeless tobacco: Never  Vaping Use   Vaping status: Never Used  Substance and Sexual Activity   Alcohol use: Yes    Comment: 2-3/week   Drug use: Never   Sexual activity: Yes    Birth control/protection: None  Other Topics Concern   Not on file  Social History Narrative   Not on file   Social Drivers of Health   Financial Resource Strain: Low Risk  (08/21/2023)   Overall Financial Resource Strain (CARDIA)    Difficulty of Paying Living Expenses: Not hard at all  Food Insecurity: No Food Insecurity (08/21/2023)   Hunger Vital Sign    Worried About Running Out of Food in the Last Year: Never true    Ran Out of Food in the Last Year: Never  true  Transportation Needs: No Transportation Needs (08/21/2023)   PRAPARE - Administrator, Civil Service (Medical): No    Lack of Transportation (Non-Medical): No  Physical Activity: Sufficiently Active (08/21/2023)   Exercise Vital Sign    Days of Exercise per Week: 3 days    Minutes of Exercise per Session: 90 min  Stress: No Stress Concern Present (08/21/2023)   Brent Davis [employer] - Occupational Stress Questionnaire    Feeling of Stress : Not at all  Social Connections: Socially Isolated (08/21/2023)   Social Connection and Isolation Panel [NHANES]    Frequency of Communication with Friends and Family: Once a week    Frequency of Social Gatherings with Friends and Family: Once a week    Attends Religious Services: Never    Diplomatic Services operational officer: No    Attends Engineer, structural: Never    Marital Status: Married    Review of Systems Per HPI  Objective:  BP 122/68   Pulse 62   Temp 98.2 F (36.8 C)   Ht 5\' 10"  (1.778 m)   Wt 212 lb (96.2 kg)   SpO2 95%   BMI 30.42 kg/m      10/16/2023  9:19 AM 10/07/2023   10:55 AM 09/08/2023   12:53 PM  BP/Weight  Systolic BP 122 132 128  Diastolic BP 68 76 70  Wt. (Lbs) 212  213.6  BMI 30.42 kg/m2  30.65 kg/m2    Physical Exam Vitals and nursing note reviewed.  Constitutional:      General: He is not in acute distress.    Appearance: Normal appearance.  HENT:     Head: Normocephalic and atraumatic.  Eyes:     General:        Right eye: No discharge.        Left eye: No discharge.     Conjunctiva/sclera: Conjunctivae normal.  Cardiovascular:     Rate and Rhythm: Normal rate and regular rhythm.  Pulmonary:     Effort: Pulmonary effort is normal.     Breath sounds: Normal breath sounds. No wheezing, rhonchi or rales.  Neurological:     Mental Status: He is alert.  Psychiatric:        Mood and Affect: Mood normal.        Behavior: Behavior normal.     Lab  Results  Component Value Date   WBC 4.5 07/28/2023   HGB 12.9 (L) 08/04/2023   HGB 13.3 08/04/2023   HCT 38.0 (L) 08/04/2023   HCT 39.0 08/04/2023   PLT 176 07/28/2023   GLUCOSE 98 07/28/2023   CHOL 150 08/26/2022   TRIG 57 08/26/2022   HDL 50 08/26/2022   LDLCALC 88 08/26/2022   ALT 16 08/26/2022   AST 21 08/26/2022   NA 142 08/04/2023   NA 141 08/04/2023   K 3.8 08/04/2023   K 3.9 08/04/2023   CL 105 07/28/2023   CREATININE 0.96 07/28/2023   BUN 11 07/28/2023   CO2 26 07/28/2023     Assessment & Plan:  DOE (dyspnea on exertion) Assessment & Plan: Improved.  Will continue monitor closely.   Coronary artery disease involving native coronary artery of native heart without angina pectoris  Hyperlipidemia, unspecified hyperlipidemia type Assessment & Plan: Fair control.  Continue Crestor .    Follow-up:  6 months  Brent Swayne Debrah Fan DO [facility]

## 2023-10-19 NOTE — Assessment & Plan Note (Signed)
 Improved.  Will continue monitor closely.

## 2023-10-23 DIAGNOSIS — E669 Obesity, unspecified: Secondary | ICD-10-CM | POA: Diagnosis not present

## 2023-10-23 DIAGNOSIS — F17211 Nicotine dependence, cigarettes, in remission: Secondary | ICD-10-CM | POA: Diagnosis not present

## 2023-10-23 DIAGNOSIS — Z6830 Body mass index (BMI) 30.0-30.9, adult: Secondary | ICD-10-CM | POA: Diagnosis not present

## 2023-10-23 DIAGNOSIS — Z008 Encounter for other general examination: Secondary | ICD-10-CM | POA: Diagnosis not present

## 2023-10-29 ENCOUNTER — Ambulatory Visit (HOSPITAL_COMMUNITY)
Admission: RE | Admit: 2023-10-29 | Discharge: 2023-10-29 | Disposition: A | Discharge status: Home / Self Care | Source: Ambulatory Visit | Attending: Internal Medicine | Admitting: Internal Medicine

## 2023-10-29 DIAGNOSIS — R0609 Other forms of dyspnea: Secondary | ICD-10-CM | POA: Insufficient documentation

## 2023-10-29 DIAGNOSIS — J479 Bronchiectasis, uncomplicated: Secondary | ICD-10-CM | POA: Insufficient documentation

## 2023-10-29 LAB — PULMONARY FUNCTION TEST
DL/VA % pred: 113 %
DL/VA: 4.57 ml/min/mmHg/L
DLCO unc % pred: 91 %
DLCO unc: 23.93 ml/min/mmHg
FEF 25-75 Post: 1.72 L/s
FEF 25-75 Pre: 1.17 L/s
FEF2575-%Change-Post: 46 %
FEF2575-%Pred-Post: 66 %
FEF2575-%Pred-Pre: 45 %
FEV1-%Change-Post: 14 %
FEV1-%Pred-Post: 61 %
FEV1-%Pred-Pre: 53 %
FEV1-Post: 2.03 L
FEV1-Pre: 1.77 L
FEV1FVC-%Change-Post: -13 %
FEV1FVC-%Pred-Pre: 89 %
FEV6-%Change-Post: 31 %
FEV6-%Pred-Post: 79 %
FEV6-%Pred-Pre: 60 %
FEV6-Post: 3.38 L
FEV6-Pre: 2.58 L
FEV6FVC-%Change-Post: -3 %
FEV6FVC-%Pred-Post: 100 %
FEV6FVC-%Pred-Pre: 103 %
FVC-%Change-Post: 32 %
FVC-%Pred-Post: 78 %
FVC-%Pred-Pre: 59 %
FVC-Post: 3.55 L
FVC-Pre: 2.68 L
Post FEV1/FVC ratio: 57 %
Post FEV6/FVC ratio: 95 %
Pre FEV1/FVC ratio: 66 %
Pre FEV6/FVC Ratio: 98 %
RV % pred: 214 %
RV: 5.17 L
TLC % pred: 115 %
TLC: 8.1 L

## 2023-10-29 MED ORDER — ALBUTEROL SULFATE (2.5 MG/3ML) 0.083% IN NEBU
2.5000 mg | INHALATION_SOLUTION | Freq: Once | RESPIRATORY_TRACT | Status: AC
  Administered 2023-10-29: 2.5 mg via RESPIRATORY_TRACT

## 2023-11-01 ENCOUNTER — Ambulatory Visit: Payer: Self-pay | Admitting: Internal Medicine

## 2023-11-01 ENCOUNTER — Encounter: Payer: Self-pay | Admitting: Internal Medicine

## 2023-11-02 NOTE — Progress Notes (Signed)
 Spoke with pt about his PFT results he confirmed his understanding.

## 2023-11-25 NOTE — Progress Notes (Signed)
 Brent Davis, male    DOB: September 07, 1955    MRN: [id number]   Brief patient profile:  53  yowm  from [place] recalls severe pna at least twice as teenager once needed admit at age 68  stopped smoking 2001/MM   s obvious sequelae  referred to pulmonary clinic in [place]  06/24/2023 by Dr Bluford  for bronchiectasis  dx on w/u for doe since 2021 .    Spring summer allergic rhinitis while in [place] starting in his 67s never complicated by  need for  inhalers   seems gradually worse    History of Present Illness  06/24/2023  Pulmonary/ 1st office eval/ Brent Davis / [facility]  Chief Complaint  Patient presents with   Consult    Bronchiectasis, shortness of breathe  Dyspnea: limited to 10 min pushing mower with variable doe to mb and back = 100 ft slt incline back s stopping then sob ever since moving from [place] ( 6 y prior to OV )  where grade was flat but same distance so probably not new onset and has not really changed much since lived there  Cough: none including am  Sleep: sleeps flat one pillow  hears wheeze forever does not keep him up  SABA use: none  02: none  Rec Bronchiectasis =   you have scarring of your bronchial tubes which means that they don't function perfectly normally l  I emphasized that nasal steroids (like flonase ) have no immediate benefit  - PFT's  10/29/23  FEV1 2.03 (61 % ) ratio 0.57  p 14 % improvement from saba p 0 prior to study with DLCO  23.93 (91%)   and FV curve mildly concave and ERV 20% at wt 212        11/26/2023  f/u ov/[place] office/Brent Davis re: doe x 2021 /  obst bronchiectasis with copd GOLD 2 criteria   maint on xyzal   for rhinitis but no resp rx otherwise  Chief Complaint  Patient presents with   Follow-up    Review PFT. Breathing is some better since the last visit.   Dyspnea:  improved, noted difficulty with doe snorkeling, no problem with yard work Cough: none  Sleeping: flat bed / 2 pillows s    resp cc  SABA use: none  02: none       No obvious day to day or daytime variability or assoc excess/ purulent sputum or mucus plugs or hemoptysis or cp or chest tightness, subjective wheeze or overt sinus or hb symptoms.    Also denies any obvious fluctuation of symptoms with weather or environmental changes or other aggravating or alleviating factors except as outlined above   No unusual exposure hx or h/o childhood pna/ asthma or knowledge of premature birth.  Current Allergies, Complete Past Medical History, Past Surgical History, Family History, and Social History were reviewed in [employer] record.  ROS  The following are not active complaints unless bolded Hoarseness, sore throat, dysphagia, dental problems, itching, sneezing,  nasal congestion or discharge of excess mucus or purulent secretions, ear ache,   fever, chills, sweats, unintended wt loss or wt gain, classically pleuritic or exertional cp,  orthopnea pnd or arm/hand swelling  or leg swelling, presyncope, palpitations, abdominal pain, anorexia, nausea, vomiting, diarrhea  or change in bowel habits or change in bladder habits, change in stools or change in urine, dysuria, hematuria,  rash, arthralgias, visual complaints, headache, numbness, weakness or ataxia or problems with walking or coordination,  change in mood or  memory.        Current Meds  Medication Sig   aspirin  EC 81 MG tablet Take 1 tablet (81 mg total) by mouth daily. Swallow whole.   levocetirizine (XYZAL ) 5 MG tablet TAKE 1 TABLET BY MOUTH EVERY DAY IN THE EVENING   rosuvastatin  (CRESTOR ) 20 MG tablet Take 1 tablet (20 mg total) by mouth daily.             Objective:    Wts  11/26/2023       210   10/16/23 212 lb (96.2 kg)  09/08/23 213 lb 9.6 oz (96.9 kg)  08/21/23 225 lb (102.1 kg)     Vital signs reviewed  11/26/2023  - Note at rest 02 sats  95% on RA   General appearance:    amb wm nad    HEENT : Oropharynx  clear/ top denture   Nasal turbinates nl     NECK :  without  apparent JVD/ palpable Nodes/TM    LUNGS: no acc muscle use,  Min barrel  contour chest wall with bilateral  scattered insp squeaks and exp rhonchi and  without cough on insp or exp maneuvers and min  Hyperresonant  to  percussion bilaterally    CV:  RRR  no s3 or murmur or increase in P2, and no edema   ABD:  soft and nontender with pos end  insp Hoover's  in the supine position.  No bruits or organomegaly appreciated   MS:  Nl gait/ ext warm without deformities Or obvious joint restrictions  calf tenderness, cyanosis or clubbing     SKIN: warm and dry with hyperpigmentation/  venous stasis changes L > R LEs  NEURO:  alert, approp, nl sensorium with  no motor or cerebellar deficits apparent.              I personally reviewed images and agree with radiology impression as follows:   Chest CT w/o contrast 12/09/22    1. Focal moderate varicoid bronchiectasis in the posteromedial right lower lobe with associated patchy tree-in-bud opacity and peribronchovascular nodularity up to 0.7 cm, probably infectious or inflammatory such as due to recurrent aspiration or atypical mycobacterial infection (MAI).   2. No evidence of interstitial lung disease. 3. One vessel coronary atherosclerosis. 4.  Aortic Atherosclerosis (ICD10-I70.0)  CT chest 04/29/23 on coronary cuts: No change      CXR PA and Lateral:   11/26/2023 :    I personally reviewed images and impression is as follows:     Mild kyphosis/ non-specfic mild increase in markings  Assessment

## 2023-11-26 ENCOUNTER — Ambulatory Visit: Admitting: Internal Medicine

## 2023-11-26 ENCOUNTER — Encounter: Payer: Self-pay | Admitting: Internal Medicine

## 2023-11-26 ENCOUNTER — Ambulatory Visit (HOSPITAL_COMMUNITY)
Admission: RE | Admit: 2023-11-26 | Discharge: 2023-11-26 | Disposition: A | Discharge status: Home / Self Care | Source: Ambulatory Visit | Attending: Internal Medicine | Admitting: Internal Medicine

## 2023-11-26 VITALS — BP 120/72 | HR 55 | Ht 70.0 in | Wt 210.0 lb

## 2023-11-26 DIAGNOSIS — Z87891 Personal history of nicotine dependence: Secondary | ICD-10-CM | POA: Diagnosis not present

## 2023-11-26 DIAGNOSIS — J479 Bronchiectasis, uncomplicated: Secondary | ICD-10-CM

## 2023-11-26 MED ORDER — ALBUTEROL SULFATE HFA 108 (90 BASE) MCG/ACT IN AERS: INHALATION_SPRAY | RESPIRATORY_TRACT | 11 refills | Status: AC

## 2023-11-26 MED ORDER — BREZTRI AEROSPHERE 160-9-4.8 MCG/ACT IN AERO: 2.0000 | INHALATION_SPRAY | Freq: Two times a day (BID) | RESPIRATORY_TRACT | Status: AC

## 2023-11-26 NOTE — Patient Instructions (Addendum)
 Ok to try albuterol   (or brezri (2 pffs every 12 h)  15 min before an activity (on alternating days)  that you know would usually make you short of breath and see if it makes any difference and if makes none then don't take albuterol  after activity unless you can't catch your breath as this means it's the resting that helps, not the albuterol .   Please remember to go to the  x-ray department  @  [facility] for your tests - we will call you with the results when they are available      Please schedule a follow up visit in 12 months but call sooner if needed -bring inhaler if on one

## 2023-11-28 NOTE — Assessment & Plan Note (Addendum)
 Quit smoking 2001/ MM H/o 2  CAP's as teenager, admit x one  Chest CT w/o contrast 12/09/22    1. Focal moderate varicoid bronchiectasis in the posteromedial right lower lobe with associated patchy tree-in-bud opacity and peribronchovascular nodularity up to 0.7 cm, probably infectious or inflammatory such as due to recurrent aspiration or atypical mycobacterial infection (MAI).   CT chest 04/29/23 on coronary cuts: No change  -  Quant Gold TB 06/24/2023 >>> neg  - Alpha one AT 06/24/2023 >>> MM   level 156  - PFT's  10/29/23  FEV1 2.03 (61 % ) ratio 0.57  p 14 % improvement from saba p 0 prior to study with DLCO  23.93 (91%)   and FV curve mildly concave and ERV 20% at wt 212   - 11/26/2023  After extensive coaching inhaler device,  effectiveness =    80% (short ti). Breztri  sample and use saba prn   He has minimal symptoms at present but enough reversiblity on PFTs to try a sample of breztri  at least 15 min before activity and see if notes a difference  - can use the albuterol  the saame way but I explained the benefit of a 12 hour duration bronchodilator if he can afford it - otherwise albuteral hfa is ok to to use the same way since he's already mastered the tecnhniquex  F/u q 12 m sooner prn          Each maintenance medication was reviewed in detail including emphasizing most importantly the difference between maintenance and prns and under what circumstances the prns are to be triggered using an action plan format where appropriate.  Total time for H and P, chart review, counseling, reviewing hfa device(s) and generating customized AVS unique to this office visit / same day charting = 32 min

## 2023-12-03 ENCOUNTER — Ambulatory Visit: Payer: Self-pay | Admitting: Internal Medicine

## 2024-02-24 ENCOUNTER — Encounter (INDEPENDENT_AMBULATORY_CARE_PROVIDER_SITE_OTHER): Payer: Self-pay | Admitting: Gastroenterology

## 2024-04-18 ENCOUNTER — Ambulatory Visit: Admitting: Family Medicine

## 2024-04-19 ENCOUNTER — Ambulatory Visit: Admitting: Family Medicine

## 2024-04-20 ENCOUNTER — Ambulatory Visit (HOSPITAL_COMMUNITY): Admission: RE | Admit: 2024-04-20 | Discharge: 2024-04-20 | Attending: Internal Medicine | Admitting: Internal Medicine

## 2024-04-20 DIAGNOSIS — R911 Solitary pulmonary nodule: Secondary | ICD-10-CM | POA: Insufficient documentation

## 2024-04-26 ENCOUNTER — Ambulatory Visit: Admitting: Family Medicine

## 2024-04-26 ENCOUNTER — Encounter: Payer: Self-pay | Admitting: Family Medicine

## 2024-04-26 VITALS — BP 146/85 | HR 62 | Temp 97.9°F | Ht 70.0 in | Wt 210.0 lb

## 2024-04-26 DIAGNOSIS — R7309 Other abnormal glucose: Secondary | ICD-10-CM

## 2024-04-26 DIAGNOSIS — Z130 Encounter for screening for diseases of the blood and blood-forming organs and certain disorders involving the immune mechanism: Secondary | ICD-10-CM

## 2024-04-26 DIAGNOSIS — I2510 Atherosclerotic heart disease of native coronary artery without angina pectoris: Secondary | ICD-10-CM

## 2024-04-26 DIAGNOSIS — J479 Bronchiectasis, uncomplicated: Secondary | ICD-10-CM

## 2024-04-26 DIAGNOSIS — E785 Hyperlipidemia, unspecified: Secondary | ICD-10-CM

## 2024-04-26 MED ORDER — ROSUVASTATIN CALCIUM 20 MG PO TABS: 20.0000 mg | ORAL_TABLET | Freq: Every day | ORAL | 3 refills | Status: AC

## 2024-04-26 NOTE — Progress Notes (Signed)
 Subjective:  Patient ID: Brent Davis, male    DOB: [birth year]  Age: 68 y.o. MRN: [id number]  CC:   Chief Complaint  Patient presents with   6 month follow up     No concerns Recent chest CT scanned by pulmonology    HPI:  68 year old male presents for follow-up.  Overall, he states that he is doing well.  No chest pain.  Shortness of breath at baseline.  Follows with pulmonology.  Last LDL was 88.  Needs labs today.  Would like LDL below 70 given nonobstructive coronary artery disease.  Patient Active Problem List   Diagnosis Date Noted   Hyperlipidemia 07/15/2023   Allergic rhinitis 06/25/2023   CAD (coronary artery disease) 04/16/2023   Obstructive bronchiectasis (HCC) with AB component equivalent of GOLD 2 COPD 04/16/2023   DOE (dyspnea on exertion) 08/26/2022   Bilateral sensorineural hearing loss 04/09/2022   Osteoarthritis of right knee 12/02/2021    Social Hx   Social History   Socioeconomic History   Marital status: Married    Spouse name: Not on file   Number of children: Not on file   Years of education: Not on file   Highest education level: 12th grade  Occupational History   Not on file  Tobacco Use   Smoking status: Former    Current packs/day: 0.00    Average packs/day: 1 pack/day for 28.0 years (28.0 ttl pk-yrs)    Types: Cigarettes    Start date: 74    Quit date: 2001    Years since quitting: 24.9   Smokeless tobacco: Never  Vaping Use   Vaping status: Never Used  Substance and Sexual Activity   Alcohol use: Yes    Comment: 2-3/week   Drug use: Never   Sexual activity: Yes    Birth control/protection: None  Other Topics Concern   Not on file  Social History Narrative   Not on file   Social Drivers of Health   Tobacco Use: Medium Risk (04/26/2024)   Patient History    Smoking Tobacco Use: Former    Smokeless Tobacco Use: Never    Passive Exposure: Not on file  Financial Resource Strain: Patient Declined (04/15/2024)    Overall Financial Resource Strain (CARDIA)    Difficulty of Paying Living Expenses: Patient declined  Food Insecurity: Patient Declined (04/15/2024)   Epic    Worried About Programme Researcher, Broadcasting/film/video in the Last Year: Patient declined    Barista in the Last Year: Patient declined  Transportation Needs: No Transportation Needs (04/15/2024)   Epic    Lack of Transportation (Medical): No    Lack of Transportation (Non-Medical): No  Physical Activity: Sufficiently Active (04/15/2024)   Exercise Vital Sign    Days of Exercise per Week: 3 days    Minutes of Exercise per Session: 150+ min  Stress: No Stress Concern Present (04/15/2024)   [employer] of Occupational [employer] - Occupational Stress Questionnaire    Feeling of Stress: Not at all  Social Connections: Unknown (04/15/2024)   Social Connection and Isolation Panel    Frequency of Communication with Friends and Family: Patient declined    Frequency of Social Gatherings with Friends and Family: Patient declined    Attends Religious Services: Patient declined    Active Member of Clubs or Organizations: No    Attends Banker Meetings: Not on file    Marital Status: Patient declined  Depression (PHQ2-9): Low Risk (04/26/2024)   Depression (  PHQ2-9)    PHQ-2 Score: 0  Alcohol Screen: Low Risk (04/15/2024)   Alcohol Screen    Last Alcohol Screening Score (AUDIT): 2  Housing: Unknown (04/15/2024)   Epic    Unable to Pay for Housing in the Last Year: Patient declined    Number of Times Moved in the Last Year: Not on file    Homeless in the Last Year: No  Utilities: Not At Risk (08/21/2023)   [employer] Utilities    Threatened with loss of utilities: No  Health Literacy: Adequate Health Literacy (08/21/2023)   B1300 Health Literacy    Frequency of need for help with medical instructions: Never    Review of Systems Per HPI  Objective:  BP (!) 146/85   Pulse 62   Temp 97.9 F (36.6 C)   Ht 5' 10 (1.778 m)   Wt 210 lb (95.3  kg)   SpO2 97%   BMI 30.13 kg/m      04/26/2024    9:57 AM 04/26/2024    9:26 AM 11/26/2023    9:34 AM  BP/Weight  Systolic BP 146 148   Diastolic BP 85 85   Wt. (Lbs)  210 210  BMI  30.13 kg/m2 30.13 kg/m2    Physical Exam Vitals and nursing note reviewed.  Constitutional:      General: He is not in acute distress.    Appearance: Normal appearance.  HENT:     Head: Normocephalic and atraumatic.  Eyes:     General:        Right eye: No discharge.        Left eye: No discharge.     Conjunctiva/sclera: Conjunctivae normal.  Cardiovascular:     Rate and Rhythm: Normal rate and regular rhythm.  Pulmonary:     Effort: Pulmonary effort is normal.     Breath sounds: Normal breath sounds. No wheezing, rhonchi or rales.  Neurological:     Mental Status: He is alert.  Psychiatric:        Mood and Affect: Mood normal.        Behavior: Behavior normal.     Lab Results  Component Value Date   WBC 4.5 07/28/2023   HGB 12.9 (L) 08/04/2023   HGB 13.3 08/04/2023   HCT 38.0 (L) 08/04/2023   HCT 39.0 08/04/2023   PLT 176 07/28/2023   GLUCOSE 98 07/28/2023   CHOL 150 08/26/2022   TRIG 57 08/26/2022   HDL 50 08/26/2022   LDLCALC 88 08/26/2022   ALT 16 08/26/2022   AST 21 08/26/2022   NA 142 08/04/2023   NA 141 08/04/2023   K 3.8 08/04/2023   K 3.9 08/04/2023   CL 105 07/28/2023   CREATININE 0.96 07/28/2023   BUN 11 07/28/2023   CO2 26 07/28/2023     Assessment & Plan:  Coronary artery disease involving native coronary artery of native heart without angina pectoris Assessment & Plan: Stable.   Screening for deficiency anemia -     CBC  Hyperlipidemia, unspecified hyperlipidemia type Assessment & Plan: Lipid panel today to assess.  Continue Crestor .  Orders: -     Lipid panel -     Rosuvastatin  Calcium ; Take 1 tablet (20 mg total) by mouth daily.  Dispense: 90 tablet; Refill: 3  Elevated glucose -     CMP14+EGFR  Obstructive bronchiectasis (HCC) with AB  component equivalent of GOLD 2 COPD Assessment & Plan: Stable.  Follow-up with pulmonology.  Using inhaler infrequently.  Follow-up: 1 year  Jacqulyn Ahle DO [facility]

## 2024-04-26 NOTE — Assessment & Plan Note (Signed)
 Stable.  Follow-up with pulmonology.  Using inhaler infrequently.

## 2024-04-26 NOTE — Assessment & Plan Note (Signed)
Lipid panel today to assess.  Continue Crestor. 

## 2024-04-26 NOTE — Assessment & Plan Note (Signed)
 Stable

## 2024-04-26 NOTE — Patient Instructions (Signed)
 Labs today.  Follow up annually.  Merry Christmas  Dr. Bluford

## 2024-04-27 ENCOUNTER — Ambulatory Visit: Payer: Self-pay | Admitting: Family Medicine

## 2024-04-27 LAB — CBC
Hematocrit: 45.2 % (ref 37.5–51.0)
Hemoglobin: 14.8 g/dL (ref 13.0–17.7)
MCH: 31.8 pg (ref 26.6–33.0)
MCHC: 32.7 g/dL (ref 31.5–35.7)
MCV: 97 fL (ref 79–97)
Platelets: 191 x10E3/uL (ref 150–450)
RBC: 4.66 x10E6/uL (ref 4.14–5.80)
RDW: 12.4 % (ref 11.6–15.4)
WBC: 5.6 x10E3/uL (ref 3.4–10.8)

## 2024-04-27 LAB — CMP14+EGFR
ALT: 17 IU/L (ref 0–44)
AST: 23 IU/L (ref 0–40)
Albumin: 4.2 g/dL (ref 3.9–4.9)
Alkaline Phosphatase: 69 IU/L (ref 47–123)
BUN/Creatinine Ratio: 17 (ref 10–24)
BUN: 18 mg/dL (ref 8–27)
Bilirubin Total: 0.6 mg/dL (ref 0.0–1.2)
CO2: 26 mmol/L (ref 20–29)
Calcium: 8.9 mg/dL (ref 8.6–10.2)
Chloride: 101 mmol/L (ref 96–106)
Creatinine, Ser: 1.03 mg/dL (ref 0.76–1.27)
Globulin, Total: 2.2 g/dL (ref 1.5–4.5)
Glucose: 97 mg/dL (ref 70–99)
Potassium: 4.5 mmol/L (ref 3.5–5.2)
Sodium: 140 mmol/L (ref 134–144)
Total Protein: 6.4 g/dL (ref 6.0–8.5)
eGFR: 79 mL/min/1.73 (ref >59)

## 2024-04-27 LAB — LIPID PANEL
Chol/HDL Ratio: 1.7 ratio (ref 0.0–5.0)
Cholesterol, Total: 120 mg/dL (ref 100–199)
HDL: 69 mg/dL (ref >39)
LDL Chol Calc (NIH): 41 mg/dL (ref 0–99)
Triglycerides: 34 mg/dL (ref 0–149)
VLDL Cholesterol Cal: 10 mg/dL (ref 5–40)

## 2024-05-03 ENCOUNTER — Ambulatory Visit (HOSPITAL_COMMUNITY)

## 2024-05-03 ENCOUNTER — Ambulatory Visit: Payer: Self-pay | Admitting: Internal Medicine

## 2024-05-03 DIAGNOSIS — R911 Solitary pulmonary nodule: Secondary | ICD-10-CM

## 2024-05-03 NOTE — Telephone Encounter (Signed)
 Sent results over [facility] and ordered scan to be repeated in 1 year.

## 2024-05-21 ENCOUNTER — Other Ambulatory Visit: Payer: Self-pay | Admitting: Internal Medicine

## 2024-08-26 ENCOUNTER — Ambulatory Visit
